# Patient Record
Sex: Female | Born: 1980 | Race: Black or African American | Hispanic: No | Marital: Single | Smoking: Former smoker
Health system: Southern US, Community
[De-identification: ages and names within clinical notes are randomized; demographics above are authoritative.]

## PROBLEM LIST (undated history)

## (undated) ENCOUNTER — Inpatient Hospital Stay (HOSPITAL_COMMUNITY): Payer: Self-pay

## (undated) DIAGNOSIS — F909 Attention-deficit hyperactivity disorder, unspecified type: Secondary | ICD-10-CM

## (undated) DIAGNOSIS — R519 Headache, unspecified: Secondary | ICD-10-CM

## (undated) DIAGNOSIS — F32A Depression, unspecified: Secondary | ICD-10-CM

## (undated) DIAGNOSIS — R51 Headache: Secondary | ICD-10-CM

## (undated) DIAGNOSIS — F329 Major depressive disorder, single episode, unspecified: Secondary | ICD-10-CM

## (undated) DIAGNOSIS — D649 Anemia, unspecified: Secondary | ICD-10-CM

## (undated) HISTORY — DX: Depression, unspecified: F32.A

## (undated) HISTORY — PX: NO PAST SURGERIES: SHX2092

## (undated) HISTORY — DX: Headache: R51

## (undated) HISTORY — DX: Headache, unspecified: R51.9

## (undated) HISTORY — DX: Attention-deficit hyperactivity disorder, unspecified type: F90.9

## (undated) HISTORY — DX: Anemia, unspecified: D64.9

## (undated) HISTORY — DX: Major depressive disorder, single episode, unspecified: F32.9

---
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
## 2000-03-17 ENCOUNTER — Encounter: Admission: RE | Admit: 2000-03-17 | Discharge: 2000-03-17 | Payer: Self-pay | Admitting: Sports Medicine

## 2000-03-17 ENCOUNTER — Ambulatory Visit (HOSPITAL_COMMUNITY): Admission: RE | Admit: 2000-03-17 | Discharge: 2000-03-17 | Payer: Self-pay | Admitting: Sports Medicine

## 2010-12-06 ENCOUNTER — Emergency Department (HOSPITAL_COMMUNITY)
Admission: EM | Admit: 2010-12-06 | Discharge: 2010-12-06 | Disposition: A | Discharge status: Home / Self Care | Payer: Self-pay | Attending: Emergency Medicine | Admitting: Emergency Medicine

## 2010-12-06 DIAGNOSIS — N12 Tubulo-interstitial nephritis, not specified as acute or chronic: Secondary | ICD-10-CM | POA: Insufficient documentation

## 2010-12-06 DIAGNOSIS — N739 Female pelvic inflammatory disease, unspecified: Secondary | ICD-10-CM | POA: Insufficient documentation

## 2010-12-06 LAB — URINALYSIS, ROUTINE W REFLEX MICROSCOPIC
Bilirubin Urine: NEGATIVE
Glucose, UA: 100 mg/dL — AB
Ketones, ur: >80 mg/dL — AB
Protein, ur: 100 mg/dL — AB

## 2010-12-06 LAB — URINE MICROSCOPIC-ADD ON

## 2010-12-06 LAB — WET PREP, GENITAL
Trich, Wet Prep: NONE SEEN
Yeast Wet Prep HPF POC: NONE SEEN

## 2011-11-14 ENCOUNTER — Telehealth: Payer: Self-pay

## 2011-11-14 NOTE — Telephone Encounter (Signed)
Tb test results from Oct 2012 faxed with confirmation.

## 2011-11-14 NOTE — Telephone Encounter (Signed)
PT NEEDING COPY OF LAST TB TEST FAXED TO [facility] @ [phone]   BEST PHONE [phone]

## 2014-01-08 ENCOUNTER — Inpatient Hospital Stay (HOSPITAL_COMMUNITY)
Admission: AD | Admit: 2014-01-08 | Discharge: 2014-01-08 | Disposition: A | Discharge status: Home / Self Care | Payer: Medicaid Other | Source: Ambulatory Visit | Attending: Obstetrics and Gynecology | Admitting: Obstetrics and Gynecology

## 2014-01-08 ENCOUNTER — Encounter (HOSPITAL_COMMUNITY): Payer: Self-pay | Admitting: Advanced Practice Midwife

## 2014-01-08 ENCOUNTER — Encounter (HOSPITAL_COMMUNITY): Payer: Self-pay | Admitting: Emergency Medicine

## 2014-01-08 ENCOUNTER — Emergency Department (HOSPITAL_COMMUNITY)
Admission: EM | Admit: 2014-01-08 | Discharge: 2014-01-08 | Discharge status: Against Medical Advice | Payer: BC Managed Care – PPO | Attending: Emergency Medicine | Admitting: Emergency Medicine

## 2014-01-08 DIAGNOSIS — R638 Other symptoms and signs concerning food and fluid intake: Secondary | ICD-10-CM | POA: Insufficient documentation

## 2014-01-08 DIAGNOSIS — Z87891 Personal history of nicotine dependence: Secondary | ICD-10-CM | POA: Insufficient documentation

## 2014-01-08 DIAGNOSIS — O210 Mild hyperemesis gravidarum: Secondary | ICD-10-CM | POA: Insufficient documentation

## 2014-01-08 DIAGNOSIS — O219 Vomiting of pregnancy, unspecified: Secondary | ICD-10-CM

## 2014-01-08 DIAGNOSIS — O9989 Other specified diseases and conditions complicating pregnancy, childbirth and the puerperium: Secondary | ICD-10-CM | POA: Diagnosis not present

## 2014-01-08 LAB — URINALYSIS, ROUTINE W REFLEX MICROSCOPIC
Glucose, UA: NEGATIVE mg/dL
Ketones, ur: >80 mg/dL — AB
LEUKOCYTES UA: NEGATIVE
NITRITE: NEGATIVE
Specific Gravity, Urine: >1.03 — ABNORMAL HIGH (ref 1.005–1.030)
UROBILINOGEN UA: 1 mg/dL (ref 0.0–1.0)
pH: 6.5 (ref 5.0–8.0)

## 2014-01-08 LAB — URINE MICROSCOPIC-ADD ON

## 2014-01-08 LAB — POCT PREGNANCY, URINE: PREG TEST UR: POSITIVE — AB

## 2014-01-08 MED ORDER — PROMETHAZINE HCL 25 MG PO TABS
25.0000 mg | ORAL_TABLET | Freq: Once | ORAL | Status: DC
  Filled 2014-01-08: qty 1

## 2014-01-08 MED ORDER — LACTATED RINGERS IV SOLN
25.0000 mg | Freq: Once | INTRAVENOUS | Status: DC
  Filled 2014-01-08: qty 1

## 2014-01-08 MED ORDER — PROMETHAZINE HCL 25 MG/ML IJ SOLN: 25.0000 mg | Freq: Once | INTRAVENOUS | Status: DC

## 2014-01-08 MED ORDER — METOCLOPRAMIDE HCL 5 MG/ML IJ SOLN: 10.0000 mg | Freq: Once | INTRAMUSCULAR | Status: DC

## 2014-01-08 MED ORDER — DEXTROSE 5 % IN LACTATED RINGERS IV BOLUS: 1000.0000 mL | Freq: Once | INTRAVENOUS | Status: DC

## 2014-01-08 MED ORDER — NATACHEW 28-1 MG PO CHEW: 1.0000 | CHEWABLE_TABLET | Freq: Every day | ORAL | Status: DC

## 2014-01-08 MED ORDER — PROMETHAZINE HCL 25 MG PO TABS: 25.0000 mg | ORAL_TABLET | Freq: Four times a day (QID) | ORAL | Status: DC | PRN

## 2014-01-08 NOTE — MAU Provider Note (Signed)
Chief Complaint: Morning Sickness and Emesis During Pregnancy   First Provider Initiated Contact with Patient 01/08/14 2118     SUBJECTIVE HPI: [name] is a 33 y.o. G1P0 at 6.4 weeks by LMP who presents with vomiting 6x per day x 2 weeks and possible dehydration. [facility] at [facility] ago for nausea, vomiting and hypokalemia. IV fluids and Rx Phenergan, Zofran and potassium. Not taking antiemetics because she states she is not having nausea, only vomits after eating or drinking. Has kept down about half of the doses of potassium. States she has lost 10 pounds in 2 weeks. Last vomited at 10 AM.  Does not know where she plans to get prenatal care.  Past Medical History  Diagnosis Date  . Medical history non-contributory    OB History  Gravida Para Term Preterm AB SAB TAB Ectopic Multiple Living  5    4  4    0    # Outcome Date GA Lbr Len/2nd Weight Sex Delivery Anes PTL Lv  5 CUR           4 TAB           3 TAB           2 TAB           1 TAB              Past Surgical History  Procedure Laterality Date  . No past surgeries     History   Social History  . Marital Status: Single    Spouse Name: N/A    Number of Children: N/A  . Years of Education: N/A   Occupational History  . Not on file.   Social History Main Topics  . Smoking status: Former Games developermoker  . Smokeless tobacco: Not on file  . Alcohol Use: No  . Drug Use: No  . Sexual Activity: Yes   Other Topics Concern  . Not on file   Social History Narrative  . No narrative on file   No current facility-administered medications on file prior to encounter.   No current outpatient prescriptions on file prior to encounter.   No Known Allergies  ROS: Pertinent positive items in HPI. Negative for fever, chills, nausea, diarrhea, constipation, dysuria, urgency, frequency, flank pain, vaginal bleeding, abdominal pain, vaginal discharge, weakness or dizziness.  OBJECTIVE Blood pressure 131/90, pulse 101,  temperature 98.2 F (36.8 C), temperature source Oral, resp. rate 18, height 5' 6.2" (1.681 m), weight 59.24 kg (130 lb 9.6 oz), last menstrual period 11/24/2013, SpO2 99.00%. Patient Vitals for the past 24 hrs:  BP Temp Temp src Pulse Resp SpO2 Height Weight  01/08/14 2243 117/77 mmHg - - 72 18 - - -  01/08/14 1947 131/90 mmHg 98.2 F (36.8 C) Oral 101 18 99 % 5' 6.2" (1.681 m) 59.24 kg (130 lb 9.6 oz)   GENERAL: Well-developed, well-nourished, slender female in no acute distress.  HEENT: Normocephalic. Mucous membranes moist. HEART: normal rate RESP: normal effort ABDOMEN: Soft, non-tender NEURO: Alert and oriented SPECULUM EXAM: Deferred  LAB RESULTS Results for orders placed during the hospital encounter of 01/08/14 (from the past 24 hour(s))  URINALYSIS, ROUTINE W REFLEX MICROSCOPIC     Status: Abnormal   Collection Time    01/08/14  7:57 PM      Result Value Ref Range   Color, Urine AMBER (*) YELLOW   APPearance CLEAR  CLEAR   Specific Gravity, Urine >1.030 (*) 1.005 - 1.030  pH 6.5  5.0 - 8.0   Glucose, UA NEGATIVE  NEGATIVE mg/dL   Hgb urine dipstick LARGE (*) NEGATIVE   Bilirubin Urine MODERATE (*) NEGATIVE   Ketones, ur >80 (*) NEGATIVE mg/dL   Protein, ur >161>300 (*) NEGATIVE mg/dL   Urobilinogen, UA 1.0  0.0 - 1.0 mg/dL   Nitrite NEGATIVE  NEGATIVE   Leukocytes, UA NEGATIVE  NEGATIVE  URINE MICROSCOPIC-ADD ON     Status: Abnormal   Collection Time    01/08/14  7:57 PM      Result Value Ref Range   Squamous Epithelial / LPF MANY (*) RARE   WBC, UA 3-6  <3 WBC/hpf   RBC / HPF 11-20  <3 RBC/hpf   Bacteria, UA MANY (*) RARE   Urine-Other MUCOUS PRESENT    POCT PREGNANCY, URINE     Status: Abnormal   Collection Time    01/08/14  8:00 PM      Result Value Ref Range   Preg Test, Ur POSITIVE (*) NEGATIVE    IMAGING No results found.  MAU COURSE IV bolus and phenergan.--Refused.  Refused PO phenergan. Tolerating ice chips and water w/out meds.    ASSESSMENT 1. Nausea and vomiting during pregnancy prior to [redacted] weeks gestation     PLAN Discharge home in stable condition. Take meds on schedule. Advance diet slowly.     Follow-up Information   Follow up with [facility].      Follow [facility] with [facility].   Contact information:   [address] [id number] [facility] [id number] [phone]       Medication List    STOP taking these medications       prenatal multivitamin Tabs tablet      TAKE these medications       NATACHEW 28-1 MG Chew  Chew 1 tablet by mouth daily.     ondansetron 4 MG disintegrating tablet  Commonly known as:  ZOFRAN-ODT  Take 4 mg by mouth every 8 (eight) hours as needed for nausea or vomiting.     potassium chloride 10 MEQ tablet  Commonly known as:  K-DUR  Take 10 mEq by mouth 2 (two) times daily.     promethazine 25 MG tablet  Commonly known as:  PHENERGAN  Take 1 tablet (25 mg total) by mouth every 6 (six) hours as needed for nausea or vomiting.         MadelineVirginia Frisco Cordts, [place] 01/08/2014  10:38 PM

## 2014-01-08 NOTE — ED Notes (Signed)
[facility]

## 2014-01-08 NOTE — Discharge Instructions (Signed)
Hyperemesis Gravidarum °Hyperemesis gravidarum is a severe form of nausea and vomiting that happens during pregnancy. Hyperemesis is worse than morning sickness. It may cause you to have nausea or vomiting all day for many days. It may keep you from eating and drinking enough food and liquids. Hyperemesis usually occurs during the first half (the first 20 weeks) of pregnancy. It often goes away once a woman is in her second half of pregnancy. However, sometimes hyperemesis continues through an entire pregnancy.  °CAUSES  °The cause of this condition is not completely known but is thought to be related to changes in the body's hormones when pregnant. It could be from the high level of the pregnancy hormone or an increase in estrogen in the body.  °SIGNS AND SYMPTOMS  °· Severe nausea and vomiting. °· Nausea that does not go away. °· Vomiting that does not allow you to keep any food down. °· Weight loss and body fluid loss (dehydration). °· Having no desire to eat or not liking food you have previously enjoyed. °DIAGNOSIS  °Your health care provider will do a physical exam and ask you about your symptoms. He or she may also order blood tests and urine tests to make sure something else is not causing the problem.  °TREATMENT  °You may only need medicine to control the problem. If medicines do not control the nausea and vomiting, you will be treated in the hospital to prevent dehydration, increased acid in the blood (acidosis), weight loss, and changes in the electrolytes in your body that may harm the unborn baby (fetus). You may need IV fluids.  °HOME CARE INSTRUCTIONS  °· Only take over-the-counter or prescription medicines as directed by your health care provider. °· Try eating a couple of dry crackers or toast in the morning before getting out of bed. °· Avoid foods and smells that upset your stomach. °· Avoid fatty and spicy foods. °· Eat 5-6 small meals a day. °· Do not drink when eating meals. Drink between  meals. °· For snacks, eat high-protein foods, such as cheese. °· Eat or suck on things that have ginger in them. Ginger helps nausea. °· Avoid food preparation. The smell of food can spoil your appetite. °· Avoid iron pills and iron in your multivitamins until after 3-4 months of being pregnant. However, consult with your health care provider before stopping any prescribed iron pills. °SEEK MEDICAL CARE IF:  °· Your abdominal pain increases. °· You have a severe headache. °· You have vision problems. °· You are losing weight. °SEEK IMMEDIATE MEDICAL CARE IF:  °· You are unable to keep fluids down. °· You vomit blood. °· You have constant nausea and vomiting. °· You have excessive weakness. °· You have extreme thirst. °· You have dizziness or fainting. °· You have a fever or persistent symptoms for more than 2-3 days. °· You have a fever and your symptoms suddenly get worse. °MAKE SURE YOU:  °· Understand these instructions. °· Will watch your condition. °· Will get help right away if you are not doing well or get worse. °Document Released: 07/11/2005 Document Revised: 05/01/2013 Document Reviewed: 02/20/2013 °ExitCare® Patient Information ©2015 [employer]. This information is not intended to replace advice given to you by your health care provider. Make sure you discuss any questions you have with your health care provider. ° ° °Eating Plan for Hyperemesis Gravidarum °Severe cases of hyperemesis gravidarum can lead to dehydration and malnutrition. The hyperemesis eating plan is one way to lessen   symptoms of nausea and vomiting. It is often used with prescribed medicines to control your symptoms.  WHAT CAN I DO TO RELIEVE MY SYMPTOMS? Listen to your body. Everyone is different and has different preferences. Find what works best for you. Some of the following things may help:  Eat and drink slowly.  Eat 5-6 small meals daily instead of 3 large meals.   Eat crackers before you get out of bed in the  morning.   Starchy foods are usually well tolerated (such as cereal, toast, bread, potatoes, pasta, rice, and pretzels).   Ginger may help with nausea. Add  tsp ground ginger to hot tea or choose ginger tea.   Try drinking 100% fruit juice or an electrolyte drink.  Continue to take your prenatal vitamins as directed by your health care provider. If you are having trouble taking your prenatal vitamins, talk with your health care provider about different options.  Include at least 1 serving of protein with your meals and snacks (such as meats or poultry, beans, nuts, eggs, or yogurt). Try eating a protein-rich snack before bed (such as cheese and crackers or a half [place] or peanut butter sandwich). WHAT THINGS SHOULD I AVOID TO REDUCE MY SYMPTOMS? The following things may help reduce your symptoms:  Avoid foods with strong smells. Try eating meals in well-ventilated areas that are free of odors.  Avoid drinking water or other beverages with meals. Try not to drink anything less than 30 minutes before and after meals.  Avoid drinking more than 1 cup of fluid at a time.  Avoid fried or high-fat foods, such as butter and cream sauces.  Avoid spicy foods.  Avoid skipping meals the best you can. Nausea can be more intense on an empty stomach. If you cannot tolerate food at that time, do not force it. Try sucking on ice chips or other frozen items and make up the calories later.  Avoid lying down within 2 hours after eating. Document Released: 05/08/2007 Document Revised: 07/16/2013 Document Reviewed: 05/15/2013 [facility] Patient Information 2015 [place], [place]. This information is not intended to replace advice given to you by your health care provider. Make sure you discuss any questions you have with your health care provider.  Pregnancy - First Trimester During sexual intercourse, millions of sperm go into the vagina. Only 1 sperm will penetrate and fertilize the female egg while it  is in the Fallopian tube. One week later, the fertilized egg implants into the wall of the uterus. An embryo begins to develop into a baby. At 6 to 8 weeks, the eyes and face are formed and the heartbeat can be seen on ultrasound. At the end of 12 weeks (first trimester), all the baby's organs are formed. Now that you are pregnant, you will want to do everything you can to have a healthy baby. Two of the most important things are to get good prenatal care and follow your caregiver's instructions. Prenatal care is all the medical care you receive before the baby's birth. It is given to prevent, find, and treat problems during the pregnancy and childbirth. PRENATAL EXAMS  During prenatal visits, your weight, blood pressure, and urine are checked. This is done to make sure you are healthy and progressing normally during the pregnancy.  A pregnant woman should gain 25 to 35 pounds during the pregnancy. However, if you are overweight or underweight, your caregiver will advise you regarding your weight.  Your caregiver will ask and answer questions for you.  Blood work, cervical cultures, other necessary tests, and a Pap test are done during your prenatal exams. These tests are done to check on your health and the probable health of your baby. Tests are strongly recommended and done for HIV with your permission. This is the virus that causes AIDS. These tests are done because medicines can be given to help prevent your baby from being born with this infection should you have been infected without knowing it. Blood work is also used to find out your blood type, previous infections, and follow your blood levels (hemoglobin).  Low hemoglobin (anemia) is common during pregnancy. Iron and vitamins are given to help prevent this. Later in the pregnancy, blood tests for diabetes will be done along with any other tests if any problems develop.  You may need other tests to make sure you and the baby are doing  well. CHANGES DURING THE FIRST TRIMESTER  Your body goes through many changes during pregnancy. They vary from person to person. Talk to your caregiver about changes you notice and are concerned about. Changes can include:  Your menstrual period stops.  The egg and sperm carry the genes that determine what you look like. Genes from you and your partner are forming a baby. The female genes determine whether the baby is a boy or a girl.  Your body increases in girth and you may feel bloated.  Feeling sick to your stomach (nauseous) and throwing up (vomiting). If the vomiting is uncontrollable, call your caregiver.  Your breasts will begin to enlarge and become tender.  Your nipples may stick out more and become darker.  The need to urinate more. Painful urination may mean you have a bladder infection.  Tiring easily.  Loss of appetite.  Cravings for certain kinds of food.  At first, you may gain or lose a couple of pounds.  You may have changes in your emotions from day to day (excited to be pregnant or concerned something may go wrong with the pregnancy and baby).  You may have more vivid and strange dreams. HOME CARE INSTRUCTIONS   It is very important to avoid all smoking, alcohol and non-prescribed drugs during your pregnancy. These affect the formation and growth of the baby. Avoid chemicals while pregnant to ensure the delivery of a healthy infant.  Start your prenatal visits by the 12th week of pregnancy. They are usually scheduled monthly at first, then more often in the last 2 months before delivery. Keep your caregiver's appointments. Follow your caregiver's instructions regarding medicine use, blood and lab tests, exercise, and diet.  During pregnancy, you are providing food for you and your baby. Eat regular, well-balanced meals. Choose foods such as meat, fish, milk and other low fat dairy products, vegetables, fruits, and whole-grain breads and cereals. Your caregiver  will tell you of the ideal weight gain.  You can help morning sickness by keeping soda crackers at the bedside. Eat a couple before arising in the morning. You may want to use the crackers without salt on them.  Eating 4 to 5 small meals rather than 3 large meals a day also may help the nausea and vomiting.  Drinking liquids between meals instead of during meals also seems to help nausea and vomiting.  A physical sexual relationship may be continued throughout pregnancy if there are no other problems. Problems may be early (premature) leaking of amniotic fluid from the membranes, vaginal bleeding, or belly (abdominal) pain.  Exercise regularly if there are no  restrictions. Check with your caregiver or physical therapist if you are unsure of the safety of some of your exercises. Greater weight gain will occur in the last 2 trimesters of pregnancy. Exercising will help:  Control your weight.  Keep you in shape.  Prepare you for labor and delivery.  Help you lose your pregnancy weight after you deliver your baby.  Wear a good support or jogging bra for breast tenderness during pregnancy. This may help if worn during sleep too.  Ask when prenatal classes are available. Begin classes when they are offered.  Do not use hot tubs, steam rooms, or saunas.  Wear your seat belt when driving. This protects you and your baby if you are in an accident.  Avoid raw meat, uncooked cheese, cat litter boxes, and soil used by cats throughout the pregnancy. These carry germs that can cause birth defects in the baby.  The first trimester is a good time to visit your dentist for your dental health. Getting your teeth cleaned is okay. Use a softer toothbrush and brush gently during pregnancy.  Ask for help if you have financial, counseling, or nutritional needs during pregnancy. Your caregiver will be able to offer counseling for these needs as well as refer you for other special needs.  Do not take any  medicines or herbs unless told by your caregiver.  Inform your caregiver if there is any mental or physical domestic violence.  Make a list of emergency phone numbers of family, friends, hospital, and police and fire departments.  Write down your questions. Take them to your prenatal visit.  Do not douche.  Do not cross your legs.  If you have to stand for long periods of time, rotate you feet or take small steps in a circle.  You may have more vaginal secretions that may require a sanitary pad. Do not use tampons or scented sanitary pads. MEDICINES AND DRUG USE IN PREGNANCY  Take prenatal vitamins as directed. The vitamin should contain 1 milligram of folic acid. Keep all vitamins out of reach of children. Only a couple vitamins or tablets containing iron may be fatal to a baby or young child when ingested.  Avoid use of all medicines, including herbs, over-the-counter medicines, not prescribed or suggested by your caregiver. Only take over-the-counter or prescription medicines for pain, discomfort, or fever as directed by your caregiver. Do not use aspirin, ibuprofen, or naproxen unless directed by your caregiver.  Let your caregiver also know about herbs you may be using.  Alcohol is related to a number of birth defects. This includes fetal alcohol syndrome. All alcohol, in any form, should be avoided completely. Smoking will cause low birth rate and premature babies.  Street or illegal drugs are very harmful to the baby. They are absolutely forbidden. A baby born to an addicted mother will be addicted at birth. The baby will go through the same withdrawal an adult does.  Let your caregiver know about any medicines that you have to take and for what reason you take them. SEEK MEDICAL CARE IF:  You have any concerns or worries during your pregnancy. It is better to call with your questions if you feel they cannot wait, rather than worry about them. SEEK IMMEDIATE MEDICAL CARE IF:    An unexplained oral temperature above 102 F (38.9 C) develops, or as your caregiver suggests.  You have leaking of fluid from the vagina (birth canal). If leaking membranes are suspected, take your temperature and inform your  caregiver of this when you call.  There is vaginal spotting or bleeding. Notify your caregiver of the amount and how many pads are used.  You develop a bad smelling vaginal discharge with a change in the color.  You continue to feel sick to your stomach (nauseated) and have no relief from remedies suggested. You vomit blood or coffee ground-like materials.  You lose more than 2 pounds of weight in 1 week.  You gain more than 2 pounds of weight in 1 week and you notice swelling of your face, hands, feet, or legs.  You gain 5 pounds or more in 1 week (even if you do not have swelling of your hands, face, legs, or feet).  You get exposed to [place] measles and have never had them.  You are exposed to fifth disease or chickenpox.  You develop belly (abdominal) pain. Round ligament discomfort is a common non-cancerous (benign) cause of abdominal pain in pregnancy. Your caregiver still must evaluate this.  You develop headache, fever, diarrhea, pain with urination, or shortness of breath.  You fall or are in a car accident or have any kind of trauma.  There is mental or physical violence in your home. Document Released: 07/05/2001 Document Revised: 04/04/2012 Document Reviewed: 05/21/2013 [facility] Patient Information 2015 [place]Care, [place]LLC. This information is not intended to replace advice given to you by your health care provider. Make sure you discuss any questions you have with your health care provider.

## 2014-01-08 NOTE — ED Notes (Signed)
Pt. reports nausea and vomitting for the past several days , poor appetite , dry mouth and weight loss , pt. stated she is 3 months pregnant ( G1P0) with no prenatal check up.

## 2014-01-08 NOTE — MAU Note (Signed)
Pt. States she last threw up this am around 10 after eating applesauce. But did eat a plum afterwards and has not thrown that up. Pt. Has Rx for Zofran, Phenergan and Potassium. [facility]. Pt. States she has lost #10 lbs in the past week. Has an appointment with [facility] tomorrow. Has not had prenatal care yet.

## 2014-01-08 NOTE — MAU Note (Signed)
States nausea and vomiting since she found out she was pregnant. [facility] because [facility] were [facility]. Not actively vomiting in triage. States [facility] and nausea medication. States she has lost 10 pounds in 2 weeks. Denies vaginal bleeding/discharge. No pain. States she just feels dehydrated.

## 2014-01-09 ENCOUNTER — Encounter (HOSPITAL_COMMUNITY): Payer: Self-pay

## 2014-01-11 LAB — CULTURE, OB URINE: Special Requests: NORMAL

## 2014-01-17 NOTE — MAU Provider Note (Signed)
Attestation of Attending Supervision of Advanced Practitioner: Evaluation and management procedures were performed by the PA/NP/CNM/OB Fellow under my supervision/collaboration. Chart reviewed and agree with management and plan.  FERGUSON,JOHN V 01/17/2014 1:47 PM

## 2014-03-17 ENCOUNTER — Other Ambulatory Visit (HOSPITAL_COMMUNITY): Payer: Self-pay | Admitting: Nurse Practitioner

## 2014-03-17 DIAGNOSIS — Z3689 Encounter for other specified antenatal screening: Secondary | ICD-10-CM

## 2014-03-17 LAB — OB RESULTS CONSOLE HIV ANTIBODY (ROUTINE TESTING): HIV: NONREACTIVE

## 2014-03-17 LAB — OB RESULTS CONSOLE RUBELLA ANTIBODY, IGM: Rubella: IMMUNE

## 2014-03-17 LAB — OB RESULTS CONSOLE HEPATITIS B SURFACE ANTIGEN: HEP B S AG: NEGATIVE

## 2014-03-17 LAB — OB RESULTS CONSOLE ANTIBODY SCREEN: Antibody Screen: NEGATIVE

## 2014-03-17 LAB — OB RESULTS CONSOLE ABO/RH: RH TYPE: POSITIVE

## 2014-04-11 ENCOUNTER — Ambulatory Visit (HOSPITAL_COMMUNITY)
Admission: RE | Admit: 2014-04-11 | Discharge: 2014-04-11 | Disposition: A | Discharge status: Home / Self Care | Payer: Medicaid Other | Source: Ambulatory Visit | Attending: Nurse Practitioner | Admitting: Nurse Practitioner

## 2014-04-11 DIAGNOSIS — Z3689 Encounter for other specified antenatal screening: Secondary | ICD-10-CM

## 2014-04-11 DIAGNOSIS — O358XX0 Maternal care for other (suspected) fetal abnormality and damage, not applicable or unspecified: Secondary | ICD-10-CM | POA: Insufficient documentation

## 2014-05-26 ENCOUNTER — Encounter (HOSPITAL_COMMUNITY): Payer: Self-pay

## 2014-06-03 LAB — OB RESULTS CONSOLE RPR: RPR: NONREACTIVE

## 2014-07-25 NOTE — L&D Delivery Note (Signed)
Vaginal Delivery Note The pt utilized an epidural as pain management.   Spontaneous rupture of membranes yesterday, at 2036, clear.  GBS was negative.  Cervical dilation was complete at  0117.     Pushing with guidance began at 0140.   After 39 minutes of pushing the head, shoulders and the body of a viable female infant "[name]" delivered spontaneously with maternal effort in the ROA position at 0219.   With vigorous tone and spontaneous cry, the infant was placed on moms abd.  After the umbilical cord was clamped it was cut by the FOB, then cord blood was obtained for evaluation.  Spontaneous delivery of a intact placenta with a 3 vessel cord via [place] at [id number].   Episiotomy: None   The vulva, perineum, vaginal vault, rectum and cervix were inspected and revealed a superficial bilateral labial and left periurethral laceration which was repaired using a 4-0 vicryl on a SH.  Lidocaine was not used, the epidural was sufficient for the repair.    Postpartum pitocin as ordered.  Fundus firm, lochia minimum, bleeding under control. EBL 200, Pt hemodynamically stable.   Sponge, laps and needle count correct and verified with the primary care nurse.  Attending MD available at all times.    Mom and baby were left in stable condition, baby skin to skin. Routine postpartum orders   Mother unsure about method of contraception   Placenta to pathology: NO     Cord Gases sent to lab: NO Cord blood sent to lab: YES   APGARS:  8 at 1 minute and 8 at 5 minutes Weight:.      Lexii Walsh, CNM, MSN 08/28/2014. 3:11 AM

## 2014-07-29 LAB — OB RESULTS CONSOLE GBS: STREP GROUP B AG: NEGATIVE

## 2014-08-26 ENCOUNTER — Inpatient Hospital Stay (HOSPITAL_COMMUNITY)
Admission: AD | Admit: 2014-08-26 | Discharge: 2014-08-26 | Disposition: A | Discharge status: Home / Self Care | Payer: Medicaid Other | Source: Ambulatory Visit | Attending: Obstetrics and Gynecology | Admitting: Obstetrics and Gynecology

## 2014-08-26 ENCOUNTER — Encounter (HOSPITAL_COMMUNITY): Payer: Self-pay | Admitting: General Practice

## 2014-08-26 DIAGNOSIS — O99343 Other mental disorders complicating pregnancy, third trimester: Secondary | ICD-10-CM | POA: Insufficient documentation

## 2014-08-26 DIAGNOSIS — O9989 Other specified diseases and conditions complicating pregnancy, childbirth and the puerperium: Secondary | ICD-10-CM | POA: Insufficient documentation

## 2014-08-26 DIAGNOSIS — D582 Other hemoglobinopathies: Secondary | ICD-10-CM | POA: Diagnosis present

## 2014-08-26 DIAGNOSIS — Z8759 Personal history of other complications of pregnancy, childbirth and the puerperium: Secondary | ICD-10-CM

## 2014-08-26 DIAGNOSIS — F32A Depression, unspecified: Secondary | ICD-10-CM | POA: Diagnosis not present

## 2014-08-26 DIAGNOSIS — F329 Major depressive disorder, single episode, unspecified: Secondary | ICD-10-CM | POA: Insufficient documentation

## 2014-08-26 DIAGNOSIS — Z3A40 40 weeks gestation of pregnancy: Secondary | ICD-10-CM | POA: Insufficient documentation

## 2014-08-26 DIAGNOSIS — F988 Other specified behavioral and emotional disorders with onset usually occurring in childhood and adolescence: Secondary | ICD-10-CM | POA: Diagnosis present

## 2014-08-26 DIAGNOSIS — Z87891 Personal history of nicotine dependence: Secondary | ICD-10-CM

## 2014-08-26 LAB — AMNISURE RUPTURE OF MEMBRANE (ROM) NOT AT ARMC: Amnisure ROM: NEGATIVE

## 2014-08-26 NOTE — MAU Note (Signed)
Was going to the bathroom, when went to sit down- had 3 leaks, doesn't think it was pee. Was 2-3 yesterday. Is contracting, q 10

## 2014-08-26 NOTE — MAU Provider Note (Signed)
History   34 yo G5P0040 at 40 5/7 weeks presented after calling to report ? leaking.  Reports cervix was 2-3 in the office yesterday by Dr. Stefano GaulStringer, now UCs q 10 min.  Denies bleeding, reports +FM.  Patient Active Problem List   Diagnosis Date Noted  . Amniotic fluid leaking 08/26/2014  . 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 ADD (attention deficit disorder) 08/26/2014  . Depression 08/26/2014  . H/O abortion x 4 08/26/2014    Chief Complaint  Patient presents with  . ? leaking    HPI:  As above  OB History    Gravida Para Term Preterm AB TAB SAB Ectopic Multiple Living   5    4 4     0      Past Medical History  Diagnosis Date  . Medical history non-contributory     Past Surgical History  Procedure Laterality Date  . No past surgeries      History reviewed. No pertinent family history.  History  Substance Use Topics  . Smoking status: Former Smoker    Quit date: 12/28/2013  . Smokeless tobacco: Not on file  . Alcohol Use: No    Allergies: No Known Allergies  Prescriptions prior to admission  Medication Sig Dispense Refill Last Dose  . acetaminophen (TYLENOL) 500 MG tablet Take 1,000 mg by mouth every 6 (six) hours as needed.   08/26/2014 at Unknown time  . Prenatal Vit-Fe Fum-Fe Bisg-FA (NATACHEW) 28-1 MG CHEW Chew 1 tablet by mouth daily. 30 tablet 12 08/25/2014 at Unknown time  . promethazine (PHENERGAN) 25 MG tablet Take 1 tablet (25 mg total) by mouth every 6 (six) hours as needed for nausea or vomiting. 30 tablet 2 prn    ROS:  Leaking small amount d/c, UCs q 10 min, +FM Physical Exam   Blood pressure 113/72, pulse 68, temperature 98.9 F (37.2 C), temperature source Oral, resp. rate 18, weight 164 lb (74.39 kg), last menstrual period 11/24/2013.    Physical Exam  Chest clear Heart RRR without murmur Abd gravid, NT Pelvic--cervix tight 2 cm, 80%, vtx, -1, IBOW, slightly bulging, no leaking Ext WNL  FHR Category 1 UCs irregular in quality and pattern,  q 8-10 min.  Results for orders placed or performed during the hospital encounter of 08/26/14 (from the past 24 hour(s))  Amnisure rupture of membrane (rom)     Status: None   Collection Time: 08/26/14  9:31 AM  Result Value Ref Range   Amnisure ROM NEGATIVE      ED Course  Assessment: IUP at 40 5/7 weeks Likely prodromal labor GBS negative   Plan: D/C home to await improvement in pattern and quality of UCs. Keep scheduled appt for induction on Thursday for induction--to return with any change in status. Comfort measures for prodromal labor reviewed.   Nigel BridgemanLATHAM, Annahi Short CNM, MSN 08/26/2014 10:30 AM

## 2014-08-26 NOTE — Discharge Instructions (Signed)
Braxton Hicks Contractions °Contractions of the uterus can occur throughout pregnancy. Contractions are not always a sign that you are in labor.  °WHAT ARE BRAXTON HICKS CONTRACTIONS?  °Contractions that occur before labor are called Braxton Hicks contractions, or false labor. Toward the end of pregnancy (32-34 weeks), these contractions can develop more often and may become more forceful. This is not true labor because these contractions do not result in opening (dilatation) and thinning of the cervix. They are sometimes difficult to tell apart from true labor because these contractions can be forceful and people have different pain tolerances. You should not feel embarrassed if you go to the hospital with false labor. Sometimes, the only way to tell if you are in true labor is for your health care provider to look for changes in the cervix. °If there are no prenatal problems or other health problems associated with the pregnancy, it is completely safe to be sent home with false labor and await the onset of true labor. °HOW CAN YOU TELL THE DIFFERENCE BETWEEN TRUE AND FALSE LABOR? °False Labor °· The contractions of false labor are usually shorter and not as hard as those of true labor.   °· The contractions are usually irregular.   °· The contractions are often felt in the front of the lower abdomen and in the groin.   °· The contractions may go away when you walk around or change positions while lying down.   °· The contractions get weaker and are shorter lasting as time goes on.   °· The contractions do not usually become progressively stronger, regular, and closer together as with true labor.   °True Labor °· Contractions in true labor last 30-70 seconds, become very regular, usually become more intense, and increase in frequency.   °· The contractions do not go away with walking.   °· The discomfort is usually felt in the top of the uterus and spreads to the lower abdomen and low back.   °· True labor can be  determined by your health care provider with an exam. This will show that the cervix is dilating and getting thinner.   °WHAT TO REMEMBER °· Keep up with your usual exercises and follow other instructions given by your health care provider.   °· Take medicines as directed by your health care provider.   °· Keep your regular prenatal appointments.   °· Eat and drink lightly if you think you are going into labor.   °· If Braxton Hicks contractions are making you uncomfortable:   °¨ Change your position from lying down or resting to walking, or from walking to resting.   °¨ Sit and rest in a tub of warm water.   °¨ Drink 2-3 glasses of water. Dehydration may cause these contractions.   °¨ Do slow and deep breathing several times an hour.   °WHEN SHOULD I SEEK IMMEDIATE MEDICAL CARE? °Seek immediate medical care if: °· Your contractions become stronger, more regular, and closer together.   °· You have fluid leaking or gushing from your vagina.   °· You have a fever.   °· You pass blood-tinged mucus.   °· You have vaginal bleeding.   °· You have continuous abdominal pain.   °· You have low back pain that you never had before.   °· You feel your baby's head pushing down and causing pelvic pressure.   °· Your baby is not moving as much as it used to.   °Document Released: 07/11/2005 Document Revised: 07/16/2013 Document Reviewed: 04/22/2013 °Exit[employer]® Patient Information ©2015 [employer]. This information is not intended to replace advice given to you by your health care   provider. Make sure you discuss any questions you have with your health care provider. ° °

## 2014-08-27 ENCOUNTER — Inpatient Hospital Stay (HOSPITAL_COMMUNITY)
Admission: AD | Admit: 2014-08-27 | Discharge: 2014-08-30 | DRG: 775 | Disposition: A | Discharge status: Home / Self Care | Payer: Medicaid Other | Source: Ambulatory Visit | Attending: Obstetrics & Gynecology | Admitting: Obstetrics & Gynecology

## 2014-08-27 ENCOUNTER — Telehealth (HOSPITAL_COMMUNITY): Payer: Self-pay | Admitting: *Deleted

## 2014-08-27 ENCOUNTER — Encounter (HOSPITAL_COMMUNITY): Payer: Self-pay | Admitting: *Deleted

## 2014-08-27 ENCOUNTER — Inpatient Hospital Stay (HOSPITAL_COMMUNITY): Payer: Medicaid Other | Admitting: Anesthesiology

## 2014-08-27 DIAGNOSIS — Z87891 Personal history of nicotine dependence: Secondary | ICD-10-CM | POA: Diagnosis not present

## 2014-08-27 DIAGNOSIS — O480 Post-term pregnancy: Principal | ICD-10-CM | POA: Diagnosis present

## 2014-08-27 DIAGNOSIS — F909 Attention-deficit hyperactivity disorder, unspecified type: Secondary | ICD-10-CM | POA: Diagnosis present

## 2014-08-27 DIAGNOSIS — Z3A41 41 weeks gestation of pregnancy: Secondary | ICD-10-CM | POA: Diagnosis present

## 2014-08-27 DIAGNOSIS — D582 Other hemoglobinopathies: Secondary | ICD-10-CM | POA: Diagnosis not present

## 2014-08-27 DIAGNOSIS — O99344 Other mental disorders complicating childbirth: Secondary | ICD-10-CM | POA: Diagnosis present

## 2014-08-27 DIAGNOSIS — F329 Major depressive disorder, single episode, unspecified: Secondary | ICD-10-CM | POA: Diagnosis present

## 2014-08-27 LAB — CBC
HEMATOCRIT: 34.5 % — AB (ref 36.0–46.0)
Hemoglobin: 12.5 g/dL (ref 12.0–15.0)
MCH: 30.4 pg (ref 26.0–34.0)
MCHC: 36.2 g/dL — ABNORMAL HIGH (ref 30.0–36.0)
MCV: 83.9 fL (ref 78.0–100.0)
Platelets: 202 10*3/uL (ref 150–400)
RBC: 4.11 MIL/uL (ref 3.87–5.11)
RDW: 13.5 % (ref 11.5–15.5)
WBC: 10.6 10*3/uL — ABNORMAL HIGH (ref 4.0–10.5)

## 2014-08-27 LAB — ABO/RH: ABO/RH(D): B POS

## 2014-08-27 LAB — TYPE AND SCREEN
ABO/RH(D): B POS
Antibody Screen: NEGATIVE

## 2014-08-27 MED ORDER — OXYCODONE-ACETAMINOPHEN 5-325 MG PO TABS: 1.0000 | ORAL_TABLET | ORAL | Status: DC | PRN

## 2014-08-27 MED ORDER — EPHEDRINE 5 MG/ML INJ
10.0000 mg | INTRAVENOUS | Status: DC | PRN
  Filled 2014-08-27: qty 2

## 2014-08-27 MED ORDER — OXYTOCIN 40 UNITS IN LACTATED RINGERS INFUSION - SIMPLE MED
62.5000 mL/h | INTRAVENOUS | Status: DC
Start: 2014-08-27 — End: 2014-08-28
  Filled 2014-08-27: qty 1000

## 2014-08-27 MED ORDER — BUTORPHANOL TARTRATE 1 MG/ML IJ SOLN
1.0000 mg | INTRAMUSCULAR | Status: DC | PRN
  Administered 2014-08-27: 1 mg via INTRAVENOUS
  Filled 2014-08-27: qty 1

## 2014-08-27 MED ORDER — FENTANYL 2.5 MCG/ML BUPIVACAINE 1/10 % EPIDURAL INFUSION (WH - ANES)
14.0000 mL/h | INTRAMUSCULAR | Status: DC | PRN
  Administered 2014-08-27 – 2014-08-28 (×2): 14 mL/h via EPIDURAL
  Filled 2014-08-27 (×2): qty 125

## 2014-08-27 MED ORDER — OXYTOCIN BOLUS FROM INFUSION
500.0000 mL | INTRAVENOUS | Status: DC
  Administered 2014-08-28: 500 mL via INTRAVENOUS

## 2014-08-27 MED ORDER — TERBUTALINE SULFATE 1 MG/ML IJ SOLN
0.2500 mg | Freq: Once | INTRAMUSCULAR | Status: AC | PRN
Start: 2014-08-27 — End: 2014-08-27

## 2014-08-27 MED ORDER — OXYCODONE-ACETAMINOPHEN 5-325 MG PO TABS: 2.0000 | ORAL_TABLET | ORAL | Status: DC | PRN

## 2014-08-27 MED ORDER — ONDANSETRON HCL 4 MG/2ML IJ SOLN: 4.0000 mg | Freq: Four times a day (QID) | INTRAMUSCULAR | Status: DC | PRN

## 2014-08-27 MED ORDER — LACTATED RINGERS IV SOLN
INTRAVENOUS | Status: DC
  Administered 2014-08-27: 18:00:00 via INTRAVENOUS

## 2014-08-27 MED ORDER — LACTATED RINGERS IV SOLN
500.0000 mL | INTRAVENOUS | Status: DC | PRN
  Administered 2014-08-27: 300 mL via INTRAVENOUS

## 2014-08-27 MED ORDER — DIPHENHYDRAMINE HCL 50 MG/ML IJ SOLN: 12.5000 mg | INTRAMUSCULAR | Status: DC | PRN

## 2014-08-27 MED ORDER — LIDOCAINE HCL (PF) 1 % IJ SOLN
INTRAMUSCULAR | Status: DC | PRN
  Administered 2014-08-27 (×2): 4 mL

## 2014-08-27 MED ORDER — LACTATED RINGERS IV SOLN
500.0000 mL | Freq: Once | INTRAVENOUS | Status: AC
  Administered 2014-08-27: 500 mL via INTRAVENOUS

## 2014-08-27 MED ORDER — OXYTOCIN 40 UNITS IN LACTATED RINGERS INFUSION - SIMPLE MED
1.0000 m[IU]/min | INTRAVENOUS | Status: DC
  Administered 2014-08-27: 1 m[IU]/min via INTRAVENOUS

## 2014-08-27 MED ORDER — PHENYLEPHRINE 40 MCG/ML (10ML) SYRINGE FOR IV PUSH (FOR BLOOD PRESSURE SUPPORT)
80.0000 ug | PREFILLED_SYRINGE | INTRAVENOUS | Status: DC | PRN
  Filled 2014-08-27: qty 2

## 2014-08-27 MED ORDER — FENTANYL 2.5 MCG/ML BUPIVACAINE 1/10 % EPIDURAL INFUSION (WH - ANES)
INTRAMUSCULAR | Status: DC | PRN
  Administered 2014-08-27: 14 mL/h via EPIDURAL

## 2014-08-27 MED ORDER — LIDOCAINE HCL (PF) 1 % IJ SOLN
30.0000 mL | INTRAMUSCULAR | Status: DC | PRN
  Filled 2014-08-27: qty 30

## 2014-08-27 MED ORDER — ACETAMINOPHEN 325 MG PO TABS: 650.0000 mg | ORAL_TABLET | ORAL | Status: DC | PRN

## 2014-08-27 MED ORDER — CITRIC ACID-SODIUM CITRATE 334-500 MG/5ML PO SOLN
30.0000 mL | ORAL | Status: DC | PRN
  Filled 2014-08-27: qty 15

## 2014-08-27 MED ORDER — PHENYLEPHRINE 40 MCG/ML (10ML) SYRINGE FOR IV PUSH (FOR BLOOD PRESSURE SUPPORT)
80.0000 ug | PREFILLED_SYRINGE | INTRAVENOUS | Status: DC | PRN
  Filled 2014-08-27: qty 2
  Filled 2014-08-27: qty 20

## 2014-08-27 NOTE — MAU Provider Note (Signed)
[name] is a 34 y.o. G5P0040 at 40.6 weeks presents from the office c/o ctx.  She denies vb or lof w/+fm   History     Patient Active Problem List   Diagnosis Date Noted  . 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 ADD (attention deficit disorder) 08/26/2014  . Depression 08/26/2014  . H/O abortion x 4 08/26/2014    Chief Complaint  Patient presents with  . Labor Eval   HPI  OB History    Gravida Para Term Preterm AB TAB SAB Ectopic Multiple Living   5    4 4     0      Past Medical History  Diagnosis Date  . Depression   . Headache   . ADHD (attention deficit hyperactivity disorder)   . Anemia     Past Surgical History  Procedure Laterality Date  . No past surgeries      Family History  Problem Relation Age of Onset  . Asthma Sister   . Depression Sister   . Asthma Maternal Grandmother   . Alcohol abuse Neg Hx   . Arthritis Neg Hx   . Birth defects Neg Hx   . Cancer Neg Hx   . COPD Neg Hx   . Diabetes Neg Hx   . Hypertension Neg Hx   . Hyperlipidemia Neg Hx   . Hearing loss Neg Hx   . Early death Neg Hx   . Heart disease Neg Hx   . Kidney disease Neg Hx   . Mental illness Neg Hx   . Mental retardation Neg Hx   . Learning disabilities Neg Hx   . Miscarriages / Stillbirths Neg Hx   . Stroke Neg Hx   . Vision loss Neg Hx   . Varicose Veins Neg Hx   . Drug abuse Other     History  Substance Use Topics  . Smoking status: Former Smoker    Quit date: 12/28/2013  . Smokeless tobacco: Never Used  . Alcohol Use: No    Allergies: No Known Allergies  Prescriptions prior to admission  Medication Sig Dispense Refill Last Dose  . acetaminophen (TYLENOL) 500 MG tablet Take 1,000 mg by mouth every 6 (six) hours as needed.   08/27/2014 at Unknown time  . Prenatal Vit-Fe Fum-Fe Bisg-FA (NATACHEW) 28-1 MG CHEW Chew 1 tablet by mouth daily. 30 tablet 12 08/27/2014 at Unknown time  . promethazine (PHENERGAN) 25 MG tablet Take 1 tablet (25 mg total) by mouth every 6  (six) hours as needed for nausea or vomiting. 30 tablet 2 More than a month at Unknown time    ROS See HPI above, all other systems are negative  Physical Exam   Blood pressure 129/84, pulse 79, temperature 97.9 F (36.6 C), temperature source Oral, resp. rate 18, last menstrual period 11/24/2013.  Physical Exam Ext:  WNL ABD: Soft, non tender to palpation, no rebound or guarding SVE: 3/90/-3   ED Course  Assessment: IUP at  40.6weeks Membranes: intact FHR: Category 1 CTX:  3 minutes   Plan:  Admit to L&D  Maddelyn Rocca, CNM, MSN 08/27/2014. 3:55 PM

## 2014-08-27 NOTE — Anesthesia Procedure Notes (Signed)
Epidural Patient location during procedure: OB Start time: 08/27/2014 5:40 PM  Staffing Anesthesiologist: Lenis Nettleton A. Performed by: anesthesiologist   Preanesthetic Checklist Completed: patient identified, site marked, surgical consent, pre-op evaluation, timeout performed, IV checked, risks and benefits discussed and monitors and equipment checked  Epidural Patient position: sitting Prep: site prepped and draped and DuraPrep Patient monitoring: continuous pulse ox and blood pressure Approach: midline Location: L4-L5 Injection technique: LOR air  Needle:  Needle type: Tuohy  Needle gauge: 17 G Needle length: 9 cm and 9 Needle insertion depth: 6 cm Catheter type: closed end flexible Catheter size: 19 Gauge Catheter at skin depth: 11 cm Test dose: negative and Other  Assessment Events: blood not aspirated, injection not painful, no injection resistance, negative IV test and no paresthesia  Additional Notes Patient identified. Risks and benefits discussed including failed block, incomplete  Pain control, post dural puncture headache, nerve damage, paralysis, blood pressure Changes, nausea, vomiting, reactions to medications-both toxic and allergic and post Partum back pain. All questions were answered. Patient expressed understanding and wished to proceed. Sterile technique was used throughout procedure. Epidural site was Dressed with sterile barrier dressing. No paresthesias, signs of intravascular injection Or signs of intrathecal spread were encountered.  Patient was more comfortable after the epidural was dosed. Please see RN's note for documentation of vital signs and FHR which are stable.

## 2014-08-27 NOTE — MAU Note (Signed)
[facility] Pt to go to room 165.

## 2014-08-27 NOTE — H&P (Signed)
[name] is a 34 y.o. female, G5 P0040 at 40.6 weeks  Patient Active Problem List   Diagnosis Date Noted  . 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 ADD (attention deficit disorder) 08/26/2014  . Depression 08/26/2014  . H/O abortion x 4 08/26/2014    Pregnancy Course: Patient transferred care from [facility] at 19 weeks.   EDC of 08/21/14 was established by LMP.      [place] evaluations:   19.5 weeks - Anatomy: S>D 21.1 weeks, FHR 132, breech, posterior placenta, cervix 3.4, Bilateral pyelectasis   Bilateral mild urinary tract dilation, right renal pelvis 5.50mm, left 5.1.  No calyceal dilation     24.5 weeks - FU:  EFW 1lb 10oz - 44.8, cervical length 3.84,  FHR 137, vertex, posteriorplacenta, cervix closed, female, bilateral pyelectasis  28.5 weeks - EFW 2lb 13oz - 37.4, cervical length 4.08, AFI 12.4, FHR 155, unilateral pyelectasis right 0.54 , left 0.39,   33.4 weeks - EFW 5lb 12oz - 44%, AFI 17.0, FHR 131, cervical klength 3.10, vertex, posterior placenta, fetal pyelectasis resolved   40.4 weeks - AFI 10.31, BPP 8/8, vertex   Significant prenatal events:   Hep C trait, ADD, Hx depression, HS for TABs, lack of food  Last evaluation:   40.6 weeks   VE:3/90/-3  Reason for admission:  labor  Pt States:   Contractions Frequency: 3         Contraction severity: strong         Fetal activity: +FM  OB History    Gravida Para Term Preterm AB TAB SAB Ectopic Multiple Living   0     Past Medical History  Diagnosis Date  . Depression   . Headache   . ADHD (attention deficit hyperactivity disorder)   . Anemia    Past Surgical History  Procedure Laterality Date  . No past surgeries     Family History: family history includes Asthma in her maternal grandmother and sister; Depression in her sister; Drug abuse in her other. There is no history of Alcohol abuse, Arthritis, Birth defects, Cancer, COPD, Diabetes, Hypertension, Hyperlipidemia, Hearing loss, Early death, Heart disease,  Kidney disease, Mental illness, Mental retardation, Learning disabilities, Miscarriages / Stillbirths, Stroke, Vision loss, or Varicose Veins. Social History:  reports that she quit smoking about 7 months ago. She has never used smokeless tobacco. She reports that she uses illicit drugs (Marijuana). She reports that she does not drink alcohol.   Prenatal Transfer Tool  Maternal Diabetes: No Genetic Screening: Normal Maternal Ultrasounds/Referrals: Abnormal:  Findings:   Fetal renal pyelectasis  - resolved  Fetal Ultrasounds or other Referrals:  None, Referred to Materal Fetal Medicine  Maternal Substance Abuse:  Yes:  Type: Marijuana, stopped the day she found out she was pregnant Significant Maternal Medications:  None Significant Maternal Lab Results: None   ROS:  See HPI above, all other systems are negative  No Known Allergies  Dilation: 3 Effacement (%): 90 Station: -1 Exam by:: Starlina Lapre, CNM Blood pressure 129/84, pulse 79, temperature 97.9 F (36.6 C), temperature source Oral, resp. rate 18, last menstrual period 11/24/2013.  Maternal Exam:  Uterine Assessment: Contraction frequency is rare.  Abdomen: Gravid, non tender. Fundal height is aga.  Normal external genitalia, vulva, cervix, uterus and adnexa.  No lesions noted on exam.  Pelvis adequate for delivery.  Fetal presentation: Vertex by VE  Fetal Exam:  Monitor Surveillance : Continuous Monitoring  Mode: Ultrasound.  NICHD: Category CTXs: Q 3-204minutes EFW   7 lbs  Physical Exam: Nursing note and vitals reviewed General: alert and cooperative She appears well nourished Psychiatric: Normal mood and affect. Her behavior is normal Head: Normocephalic Eyes: Pupils are equal, round, and reactive to light Neck: Normal range of motion Cardiovascular: RRR without murmur  Respiratory: CTAB. Effort normal  Abd: soft, non-tender, +BS, no rebound, no guarding  Genitourinary: Vagina normal  Neurological:  A&Ox3 Skin: Warm and dry  Musculoskeletal: Normal range of motion  Homan's sign negative bilaterally No evidence of DVTs.  Edema: Minimal bilaterally non-pitting edema DTR: 2+ Clonus: None   Prenatal labs: ABO, Rh: B/Positive/-- (08/24 0000) Antibody: Negative (08/24 0000) Rubella:    RPR: Nonreactive (11/10 0000)  HBsAg: Negative (08/24 0000)  HIV: Non-reactive (08/24 0000)  GBS: Negative (01/05 0000) Sickle cell/Hgb electrophoresis:  WNL Pap:   GC:    Chlamydia:  Genetic screenings:  wnl Glucola:  wnl  Assessment:  IUP at 40.6 weeks NICHD: Category Membranes:intact Bishop Score:6 GBS negative  Plan:  Admit to L&D for expectant/active management of labor. Possible augmentation options reviewed including AROM and/or pitocin.   IV pain medication per orders PRN Epidural per patient request Foley cath after patient is comfortable with epidural Anticipate SVD  Labor mgmt as ordered  Okay to ambulate around unit with wireless monitors  Okay to get up and shower without monitoring      Attending MD available at all times.  Angelisse Riso, CNM, MSN 08/27/2014, 3:58 PM       All information will be confirmed upon admisson

## 2014-08-27 NOTE — Telephone Encounter (Signed)
Preadmission screen  

## 2014-08-27 NOTE — MAU Note (Signed)
Pt uncomfortable with contractions.  Was just at office, sent for further eval

## 2014-08-27 NOTE — Anesthesia Preprocedure Evaluation (Signed)
Anesthesia Evaluation  Patient identified by MRN, date of birth, ID band Patient awake    Reviewed: Allergy & Precautions, NPO status , Patient's Chart, lab work & pertinent test results  Airway Mallampati: II  TM Distance: >3 FB Neck ROM: Full    Dental no notable dental hx. (+) Teeth Intact   Pulmonary former smoker,  breath sounds clear to auscultation  Pulmonary exam normal       Cardiovascular negative cardio ROS  Rhythm:Regular Rate:Normal     Neuro/Psych  Headaches, PSYCHIATRIC DISORDERS Depression ADHD   GI/Hepatic Neg liver ROS, GERD-  ,  Endo/Other  negative endocrine ROS  Renal/GU negative Renal ROS  negative genitourinary   Musculoskeletal negative musculoskeletal ROS (+)   Abdominal   Peds  Hematology  (+) anemia ,   Anesthesia Other Findings   Reproductive/Obstetrics (+) Pregnancy                             Anesthesia Physical Anesthesia Plan  ASA: II  Anesthesia Plan: Epidural   Post-op Pain Management:    Induction:   Airway Management Planned: Natural Airway  Additional Equipment:   Intra-op Plan:   Post-operative Plan:   Informed Consent: I have reviewed the patients History and Physical, chart, labs and discussed the procedure including the risks, benefits and alternatives for the proposed anesthesia with the patient or authorized representative who has indicated his/her understanding and acceptance.     Plan Discussed with: Anesthesiologist and CRNA  Anesthesia Plan Comments:         Anesthesia Quick Evaluation

## 2014-08-28 ENCOUNTER — Encounter (HOSPITAL_COMMUNITY): Payer: Self-pay | Admitting: *Deleted

## 2014-08-28 ENCOUNTER — Inpatient Hospital Stay (HOSPITAL_COMMUNITY): Admission: RE | Admit: 2014-08-28 | Payer: Medicaid Other | Source: Ambulatory Visit

## 2014-08-28 LAB — CBC
HEMATOCRIT: 28.9 % — AB (ref 36.0–46.0)
Hemoglobin: 10.4 g/dL — ABNORMAL LOW (ref 12.0–15.0)
MCH: 30.2 pg (ref 26.0–34.0)
MCHC: 36 g/dL (ref 30.0–36.0)
MCV: 84 fL (ref 78.0–100.0)
PLATELETS: 173 10*3/uL (ref 150–400)
RBC: 3.44 MIL/uL — ABNORMAL LOW (ref 3.87–5.11)
RDW: 13.4 % (ref 11.5–15.5)
WBC: 16.6 10*3/uL — ABNORMAL HIGH (ref 4.0–10.5)

## 2014-08-28 LAB — RPR: RPR: NONREACTIVE

## 2014-08-28 LAB — HIV ANTIBODY (ROUTINE TESTING W REFLEX): HIV Screen 4th Generation wRfx: NONREACTIVE

## 2014-08-28 LAB — GC/CHLAMYDIA PROBE AMP (~~LOC~~) NOT AT ARMC
Chlamydia: NEGATIVE
NEISSERIA GONORRHEA: NEGATIVE

## 2014-08-28 MED ORDER — IBUPROFEN 600 MG PO TABS
600.0000 mg | ORAL_TABLET | Freq: Four times a day (QID) | ORAL | Status: DC
  Administered 2014-08-28 – 2014-08-30 (×9): 600 mg via ORAL
  Filled 2014-08-28 (×7): qty 1

## 2014-08-28 MED ORDER — DIPHENHYDRAMINE HCL 25 MG PO CAPS: 25.0000 mg | ORAL_CAPSULE | Freq: Four times a day (QID) | ORAL | Status: DC | PRN

## 2014-08-28 MED ORDER — ONDANSETRON HCL 4 MG/2ML IJ SOLN: 4.0000 mg | INTRAMUSCULAR | Status: DC | PRN

## 2014-08-28 MED ORDER — PRENATAL MULTIVITAMIN CH
1.0000 | ORAL_TABLET | Freq: Every day | ORAL | Status: DC
  Administered 2014-08-28 – 2014-08-30 (×3): 1 via ORAL
  Filled 2014-08-28 (×3): qty 1

## 2014-08-28 MED ORDER — ONDANSETRON HCL 4 MG PO TABS
4.0000 mg | ORAL_TABLET | ORAL | Status: DC | PRN
Start: 2014-08-28 — End: 2014-08-30

## 2014-08-28 MED ORDER — SENNOSIDES-DOCUSATE SODIUM 8.6-50 MG PO TABS
2.0000 | ORAL_TABLET | ORAL | Status: DC
  Administered 2014-08-29 (×2): 2 via ORAL
  Filled 2014-08-28: qty 2

## 2014-08-28 MED ORDER — SIMETHICONE 80 MG PO CHEW: 80.0000 mg | CHEWABLE_TABLET | ORAL | Status: DC | PRN

## 2014-08-28 MED ORDER — ZOLPIDEM TARTRATE 5 MG PO TABS: 5.0000 mg | ORAL_TABLET | Freq: Every evening | ORAL | Status: DC | PRN

## 2014-08-28 MED ORDER — WITCH HAZEL-GLYCERIN EX PADS: 1.0000 "application " | MEDICATED_PAD | CUTANEOUS | Status: DC | PRN

## 2014-08-28 MED ORDER — TETANUS-DIPHTH-ACELL PERTUSSIS 5-2.5-18.5 LF-MCG/0.5 IM SUSP: 0.5000 mL | Freq: Once | INTRAMUSCULAR | Status: DC

## 2014-08-28 MED ORDER — OXYCODONE-ACETAMINOPHEN 5-325 MG PO TABS: 2.0000 | ORAL_TABLET | ORAL | Status: DC | PRN

## 2014-08-28 MED ORDER — LANOLIN HYDROUS EX OINT: TOPICAL_OINTMENT | CUTANEOUS | Status: DC | PRN

## 2014-08-28 MED ORDER — OXYCODONE-ACETAMINOPHEN 5-325 MG PO TABS
1.0000 | ORAL_TABLET | ORAL | Status: DC | PRN
  Administered 2014-08-28 (×3): 1 via ORAL
  Filled 2014-08-28 (×3): qty 1

## 2014-08-28 MED ORDER — BENZOCAINE-MENTHOL 20-0.5 % EX AERO
1.0000 "application " | INHALATION_SPRAY | CUTANEOUS | Status: DC | PRN
  Administered 2014-08-28: 1 via TOPICAL
  Filled 2014-08-28: qty 56

## 2014-08-28 MED ORDER — DIBUCAINE 1 % RE OINT: 1.0000 "application " | TOPICAL_OINTMENT | RECTAL | Status: DC | PRN

## 2014-08-28 NOTE — Anesthesia Postprocedure Evaluation (Signed)
  Anesthesia Post-op Note  Patient: [name]  Procedure(s) Performed: * No procedures listed *  Patient Location: Mother/Baby  Anesthesia Type:Epidural  Level of Consciousness: awake  Airway and Oxygen Therapy: Patient Spontanous Breathing  Post-op Pain: mild  Post-op Assessment: Patient's Cardiovascular Status Stable and Respiratory Function Stable  Post-op Vital Signs: stable  Last Vitals:  Filed Vitals:   08/28/14 1000  BP: 108/68  Pulse: 81  Temp: 36.9 C  Resp: 18    Complications: No apparent anesthesia complications

## 2014-08-28 NOTE — Progress Notes (Signed)
[name]  Post Partum Day 0:S/P SVD with Bilateral Labial Lacerations  Subjective: Patient up ad lib, denies syncope or dizziness. Reports consuming regular diet without issues and denies N/V. Denies issues with urination and reports bleeding is "okay."  Patient is breastfeeding and reports going well. Infant currently skin to skin with FOB. Unsure of postpartum contraception method reporting history of depo with good results, but "the goverment is trying to sterilize the [place] with this, so I need to find another method."  Pain is being appropriately managed with use of percocet and ibuprofen.  Objective: Filed Vitals:   08/28/14 0430 08/28/14 0506 08/28/14 0610 08/28/14 1000  BP: 123/69 113/78 111/68 108/68  Pulse: 93 89 88 81  Temp:  98.6 F (37 C) 98.3 F (36.8 C) 98.5 F (36.9 C)  TempSrc:  Oral Oral Oral  Resp: 18 20 18 18   Height:      Weight:      SpO2:  100% 99%     Recent Labs  08/27/14 1620 08/28/14 0655  HGB 12.5 10.4*  HCT 34.5* 28.9*    Physical Exam:  General: alert, cooperative and no distress Mood/Affect: Coherent/Appropriate Lungs: clear to auscultation, no wheezes, rales or rhonchi, symmetric air entry.  Heart: normal rate and regular rhythm. Abdomen:  + bowel sounds, Soft, NT Uterine Fundus: firm at umbilicus Lochia: appropriate Laceration: Not assessed due to location Skin: Warm, Dry DVT Evaluation: No evidence of DVT seen on physical exam. No cords or calf tenderness.  Assessment S/P Vaginal Delivery-Day Normal Involution BreastFeeding  Plan: Educated regarding appropriate BCM in PPP including condoms and NFP Continue current care Plan for discharge tomorrow Dr. Audree CamelN. Dillard to be updated on patient status   Johnattan Strassman LYNN, MSN, CNM 08/28/2014, 10:59 AM

## 2014-08-28 NOTE — Progress Notes (Signed)
Labor Progress  Subjective: Very comfortable with the epidural  Objective: BP 131/78 mmHg  Pulse 80  Temp(Src) 97.8 F (36.6 C) (Oral)  Resp 18  Ht 5\' 6"  (1.676 m)  Wt 164 lb (74.39 kg)  BMI 26.48 kg/m2  SpO2 100%  LMP 11/24/2013     FHT: 120, moderate variability, + accel, no decel CTX:  regular, every 3-5 minutes Uterus gravid, soft non tender SVE:  Dilation: 6 Effacement (%): 90 Station: 0 Exam by:: Laurajean Hosek CNM  Assessment:  IUP at 40.6 weeks NICHD: Category 1 Membranes:  SROM at 2036 x 1.5hrs, no s/s of infection Labor progress: adquate labor GBS: negaive  Plan: Continue labor plan Continuous monitoring Rest Frequent position changes to facilitate fetal rotation and descent. Will reassess with cervical exam at MN or earlier if necessary       Elyssia Strausser, CNM, MSN 08/28/2014. 12:09 AM

## 2014-08-28 NOTE — Lactation Note (Signed)
This note was copied from the chart of [name]. Lactation Consultation Note  Patient Name: [name] [id number]'s Date: 08/28/2014 Reason for consult: Initial assessment  Visited with Mom, baby 2613 hrs old.  Baby has latched on and fed well per Mom, 4 times since birth.  Reviewed basics and answered questions that Mom had.  Baby skin to skin on Mom's chest, sleeping quietly.  Reviewed importance of skin to skin, and feeding often on cue.  Brochure left in room.  Informed Mom of IP and OP lactation services available to her.  To call for help prn, f/u in am.    Consult Status Consult Status: Follow-up Date: 08/29/14 Follow-up type: In-patient    Judee ClaraSmith, Mayleen Borrero E 08/28/2014, 5:15 PM

## 2014-08-28 NOTE — Progress Notes (Signed)
UR chart review completed.  

## 2014-08-28 NOTE — Progress Notes (Signed)
Labor Progress  Subjective: Sleeping, no complaints  Objective: BP 123/88 mmHg  Pulse 85  Temp(Src) 97.8 F (36.6 C) (Oral)  Resp 18  Ht 5\' 6"  (1.676 m)  Wt 164 lb (74.39 kg)  BMI 26.48 kg/m2  SpO2 100%  LMP 11/24/2013     FHT: 120 moderate variability,  + accel, occasional variable decel CTX:  regular, every 3-4nutes Uterus gravid, soft non tender SVE:  9/90/0   Assessment:  IUP at 41.0 weeks NICHD: Category 2 Membranes:  SROM x 4hrs, no s/s of infection Labor progress: adquate labor - transition GBS: negative   Plan: Continue labor plan Continuous monitoring Rest Frequent position changes to facilitate fetal rotation and descent. Will reassess with cervical exam at 0200 or earlier if necessary      Shirle Provencal, CNM, MSN 08/28/2014. 12:28 AM

## 2014-08-29 MED ORDER — IBUPROFEN 600 MG PO TABS: 600.0000 mg | ORAL_TABLET | Freq: Four times a day (QID) | ORAL | Status: DC | PRN

## 2014-08-29 MED ORDER — OXYCODONE-ACETAMINOPHEN 5-325 MG PO TABS: 1.0000 | ORAL_TABLET | ORAL | Status: DC | PRN

## 2014-08-29 NOTE — Discharge Instructions (Signed)

## 2014-08-29 NOTE — Progress Notes (Signed)
[facility] cancel d/c for today due to family issues.  OK with regular d/c day tomorrow. D/C cancelled.  Nigel BridgemanVicki Nayra Coury, CNM 08/29/14 1:30p

## 2014-08-29 NOTE — Discharge Summary (Addendum)
  Vaginal Delivery Discharge Summary  [name]  DOB:    10/15/1980 MRN:    [id number] CSN:    [id number]  Date of admission:                  08/27/14  Date of discharge:                   08/30/14  Procedures this admission:   SVB, repair of superficial bilateral labial and left periurethral lacerations  Date of Delivery: 08/28/14  Newborn Data:  Live born female  Birth Weight: 6 lb 5.6 oz (2880 g) APGAR: 8, 9  Home with mother Name: [name]   History of Present Illness:  Ms. [name] is a 34 y.o. female, [phone], who presents at 3069w0d weeks gestation. The patient has [facility]. She was admitted onset of labor. Her pregnancy has been complicated by:  Patient Active Problem List   Diagnosis Date Noted  . Vaginal delivery 08/28/2014  . Normal labor 08/27/2014  . 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 ADD (attention deficit disorder) 08/26/2014  . Depression 08/26/2014  . H/O abortion x 4 08/26/2014     Hospital Course:  Admitted 08/27/14 in early labor. Negative GBS. Progressed spontaneously. Utilized epidural for pain management.  [facility], CNM, without complication. Patient and baby tolerated the procedure without difficulty, with superficial bilateral labial and left periurethral lacerations noted. Infant status was stable and remained in room with mother.  Mother and infant then had an uncomplicated postpartum course, with breastfeeding going well. Mom's physical exam was WNL, and she was discharged home in stable condition. Contraception plan was undecided.  She received adequate benefit from po pain medications, using Motrin and Percocet.   Feeding:  breast  Contraception:  Undecided at present.  Discharge hemoglobin:  HEMOGLOBIN  Date Value Ref Range Status  08/28/2014 10.4* 12.0 - 15.0 g/dL Final    Comment:    DELTA CHECK  NOTED REPEATED TO VERIFY    HCT  Date Value Ref Range Status  08/28/2014 28.9* 36.0 - 46.0 % Final    Discharge Physical Exam:   General: alert Lochia: appropriate Uterine Fundus: firm Incision: healing well DVT Evaluation: No evidence of DVT seen on physical exam. Negative Homan's sign.  Intrapartum Procedures: spontaneous vaginal delivery Postpartum Procedures: none Complications-Operative and Postpartum: bilateral labial and left periurethral lacerations  Discharge Diagnoses: Term Pregnancy-delivered  Discharge Information:  Activity:           pelvic rest Diet:                routine Medications: Ibuprofen and Percocet Condition:      stable Instructions:     Discharge to: home  Follow-up Information    Follow up with [facility] In 6 weeks.   Specialty:  Obstetrics and Gynecology   Why:  Call for any questions or concerns.   Contact information:   [address] [place] [phone]       Nigel BridgemanLATHAM, Maisie Hauser [facility] 08/30/2014 7:29 AM

## 2014-08-29 NOTE — Lactation Note (Signed)
This note was copied from the chart of [name]. Lactation Consultation Note: follow up visit with mom Baby has not voided since delivery. Mom reports that baby has been nursing well but she is not sure how much she is getting. Assisted with latch. Encouraged mom to massage and compress her breasts during feeding to get as much into the baby as possible. Mom easily able to hand express Colostrum. Mom very pleased- this is the first time she has seen any. Reassurance given. Asking about pump and milk storage. Reviwed Baby and me Book with mom. Has her own Medela pump. No questions at present. To call prn  Patient Name: [name] ZOXWR'UToday's Date: 08/29/2014 Reason for consult: Follow-up assessment   Maternal Data Formula Feeding for Exclusion: Yes Reason for exclusion: Mother's choice to formula and breast feed on admission Has patient been taught Hand Expression?: Yes Does the patient have breastfeeding experience prior to this delivery?: No  Feeding Feeding Type: Breast Fed Length of feed: 30 min  LATCH Score/Interventions Latch: Grasps breast easily, tongue down, lips flanged, rhythmical sucking.  Audible Swallowing: A few with stimulation  Type of Nipple: Everted at rest and after stimulation  Comfort (Breast/Nipple): Soft / non-tender     Hold (Positioning): Assistance needed to correctly position infant at breast and maintain latch.  LATCH Score: 8  Lactation Tools Discussed/Used WIC Program: No   Consult Status Consult Status: Complete    Pamelia HoitWeeks, [place] Jon D 08/29/2014, 11:27 AM

## 2014-08-29 NOTE — Progress Notes (Signed)
Clinical Social Work Department PSYCHOSOCIAL ASSESSMENT - MATERNAL/CHILD 08/29/2014  Patient:  [name], [name]  Account Number:  [id number]  Admit Date:  08/27/2014  Ardine Eng Name:   [facility]   Clinical Social Worker:  Lucita Ferrara, CLINICAL SOCIAL WORKER   Date/Time:  08/29/2014 09:00 AM  Date Referred:  08/28/2014   Referral source  Central [facility]     Referred reason  Depression/Anxiety   Other referral source:    I:  FAMILY / HOME ENVIRONMENT Child's legal guardian:  PARENT  Guardian - Name Guardian - Age Guardian - Address  Jazara Swiney [place], [place]  same residence as MOB   Other household support members/support persons Other support:   MOB and FOB endorsed strong family support.    II  PSYCHOSOCIAL DATA Information Source:  Patient Interview  Occupational hygienist Employment:   MOB stated that she is currently not working. She stated that she gradated with a college degree this past semester.   Financial resources:  Medicaid If Medicaid - County:  [place] / Grade:  N/A Music therapist / Child Services Coordination / Early Interventions:   None reported  Cultural issues impacting care:   None reported    III  STRENGTHS Strengths  Adequate Resources  Home prepared for Child (including basic supplies)  Supportive family/friends   Strength comment:    IV  RISK FACTORS AND CURRENT PROBLEMS Current Problem:  YES   Risk Factor & Current Problem Patient Issue Family Issue Risk Factor / Current Problem Comment  Mental Illness Y N MOB presents with history of depression, stemming from "many years ago".  MOB denied recent/acute symptoms.    V  SOCIAL WORK ASSESSMENT CSW met with the MOB due to maternal history of depression.  MOB presented as easily engaged and receptive to the visit.  She was observed to be providing skin to skin with the baby during the entire  visit.  She displayed an appropriate range in affect, presented in a pleasant mood, no acute mental health symptoms noted. MOB presents with insight and self-awareness related to her mental health, and was able to discuss effective ways to self-regulate if she experiences symptoms of depression.    MOB expressed excitement secondary to her transition to motherhood. She shared that it was an unexpected role transition since she never thought she could have children.  She acknowledged that she did experience symptoms of depression (feeling overwhelmed by thought of new role) when she first learned of pregnancy since she was in school and had other "life plans".  She discussed how she was able to cognitively transition/prepare during the pregnancy, which has assisted her to become excited.  MOB stated that she originally thought she was going to be unable to finish her bachelors degree because of the pregnancy, but stated that she successfully graduated in December. MOB reported that she has noted a pattern of needing to give herself a project to "better myself" when she begins to feel depressed.  She reflected upon previous periods of depression and how she has been able to reduce symptoms by "doing something about it", including going back to school and furthering her education.  She discussed awareness that she is able to confront her symptoms or "wallow in it".  MOB presented with insight that "wallowing" in depression is maladaptive coping and often causes depression to worsen.     MOB presented with awareness of  increased risk for developing postpartum depression.  She stated that she has a strong positive support system that she can utilize in the event that she experiences symptoms. MOB discussed intention/movivation to engage in coping skills and to talk to her MD if she notes symptoms of postpartum depression.   MOB denied additional questions or concerns.  She requested CSW contact information in the  event that she has questions in the postpartum period, CSW provided.  No barriers to discharge.   VI SOCIAL WORK PLAN Social Work Therapist, art  No Further Intervention Required / No Barriers to Discharge   Type of pt/family education:   Postpartum depression   If child protective services report - county:  N/A If child protective services report - date:  N/A Information/referral to community resources comment:   No referrals needed.  MOB reported intention to follow up with her MD if she notes symptoms of postpartum depression.   Other social work plan:   CSW to follow up as needed.

## 2014-08-30 NOTE — Lactation Note (Signed)
This note was copied from the chart of [name]. Lactation Consultation Note Requesting assistance in seting up and cleaning DEBP pt. Had bought. Noted good latch to breast. Demonstrated and pt. Provided teach back. Patient Name: [name] ZOXWR'UToday's Date: 08/30/2014 Reason for consult: Follow-up assessment   Maternal Data    Feeding Feeding Type: Breast Fed Length of feed: 1 min  LATCH Score/Interventions Latch: Grasps breast easily, tongue down, lips flanged, rhythmical sucking.  Audible Swallowing: Spontaneous and intermittent Intervention(s): Alternate breast massage;Hand expression  Type of Nipple: Everted at rest and after stimulation  Comfort (Breast/Nipple): Soft / non-tender     Hold (Positioning): No assistance needed to correctly position infant at breast.  LATCH Score: 10  Lactation Tools Discussed/Used Tools: Pump Breast pump type: Double-Electric Breast Pump Pump Review: Setup, frequency, and cleaning;Milk Storage Initiated by:: Peri JeffersonL. Alden Feagan RN Date initiated:: 08/30/14   Consult Status Date: 08/30/14    Charyl DancerARVER, Ilee Randleman G 08/30/2014, 1:02 PM

## 2015-07-26 NOTE — L&D Delivery Note (Signed)
35 y.o. [name] at 6093w5d delivered a viable female infant in cephalic, LOA position. Tight nuchal cord x1, delivered through with ease. Right anterior shoulder delivered with ease. 60 sec delayed cord clamping. Cord clamped x2 and cut. Placenta delivered spontaneously intact, with 3VC. Fundus firm on exam with massage and pitocin. Good hemostasis noted.  Laceration: None Suture: N/A Good hemostasis noted. EBL: 200cc  Mom and baby recovering in LDR.    Apgars: 8/9 Weight: 3115g, 6#13.9oz    Jen MowElizabeth Elita Dame, DO OB Fellow [facility] 07/16/2016, 5:38 PM

## 2016-02-01 LAB — OB RESULTS CONSOLE GC/CHLAMYDIA
Chlamydia: NEGATIVE
GC PROBE AMP, GENITAL: NEGATIVE

## 2016-02-01 LAB — OB RESULTS CONSOLE HEPATITIS B SURFACE ANTIGEN: Hepatitis B Surface Ag: NEGATIVE

## 2016-02-01 LAB — OB RESULTS CONSOLE HIV ANTIBODY (ROUTINE TESTING): HIV: NONREACTIVE

## 2016-02-01 LAB — OB RESULTS CONSOLE ABO/RH: RH Type: POSITIVE

## 2016-02-01 LAB — OB RESULTS CONSOLE RUBELLA ANTIBODY, IGM: Rubella: IMMUNE

## 2016-02-01 LAB — OB RESULTS CONSOLE RPR: RPR: NONREACTIVE

## 2016-02-01 LAB — OB RESULTS CONSOLE ANTIBODY SCREEN: ANTIBODY SCREEN: NEGATIVE

## 2016-02-11 ENCOUNTER — Other Ambulatory Visit (HOSPITAL_COMMUNITY): Payer: Self-pay | Admitting: Nurse Practitioner

## 2016-02-11 ENCOUNTER — Ambulatory Visit (HOSPITAL_COMMUNITY)
Admission: RE | Admit: 2016-02-11 | Discharge: 2016-02-11 | Disposition: A | Discharge status: Home / Self Care | Payer: Medicaid Other | Source: Ambulatory Visit | Attending: Nurse Practitioner | Admitting: Nurse Practitioner

## 2016-02-11 DIAGNOSIS — Z3689 Encounter for other specified antenatal screening: Secondary | ICD-10-CM

## 2016-02-11 DIAGNOSIS — Z3A17 17 weeks gestation of pregnancy: Secondary | ICD-10-CM

## 2016-02-11 DIAGNOSIS — O09512 Supervision of elderly primigravida, second trimester: Secondary | ICD-10-CM

## 2016-02-11 DIAGNOSIS — O09529 Supervision of elderly multigravida, unspecified trimester: Secondary | ICD-10-CM

## 2016-02-12 ENCOUNTER — Encounter (HOSPITAL_COMMUNITY): Payer: Self-pay

## 2016-02-12 DIAGNOSIS — O09529 Supervision of elderly multigravida, unspecified trimester: Secondary | ICD-10-CM | POA: Insufficient documentation

## 2016-02-12 NOTE — Progress Notes (Signed)
Genetic Counseling  High-Risk Gestation Note  Appointment Date:  02/11/16 Referred By: Trina AoBaker, Sandra K, NP Date of Birth:  04/27/1981   Pregnancy History: [id number] Estimated Date of Delivery: 07/18/16 Estimated Gestational Age: 558w3d Attending: Eulis FosterKristen Quinn, MD   Ms. [name] was seen for genetic counseling because of a maternal age of 35 y.o.. She will be 35 years old at delivery.     In summary:  Discussed AMA and associated risk for fetal aneuploidy  Reviewed Quad screen results  Reduced risks for Down syndrome (1 in 3,502)  Trisomy 18 risk increased to 1 in 440 but still in screen negative range  AFP borderline elevated (2.33 MoM)-technically screen negative for ONTD risk  Discussed options for additional screening  NIPS- declined  Ultrasound- scheduled for 02/18/16  Discussed diagnostic testing options  Amniocentesis-declined  Reviewed family history concerns  Patient reported sickle cell trait for herself and daughter (med records indicate Hemoglobin C trait for patient)  Reviewed autosomal recessive inheritance of hemoglobinopathies  Carrier screening available to the father of the pregnancy  Patient stated she would not consider prenatal diagnosis for these conditions, if they were identified to be a carrier couple  Patient is comfortable with newborn screening for hemoglobinopathies  Discussed carrier screening options  CF-declined  SMA-declined  She was counseled regarding maternal age and the association with risk for chromosome conditions due to nondisjunction with aging of the ova.   We reviewed chromosomes, nondisjunction, and the associated 1 in 141 risk for fetal aneuploidy at 3058w3d gestation related to a maternal age of 35 years old at delivery.  She was/They were counseled that the risk for aneuploidy decreases as gestational age increases, accounting for those pregnancies which spontaneously abort.  We specifically discussed Down syndrome  (trisomy 2421), trisomies 5813 and 8218, and sex chromosome aneuploidies (47,XXX and 47,XXY) including the common features and prognoses of each.   Ms. Shari RoughHicks previously had Quad screen [facility]. We reviewed these results, which were within normal range for the conditions screened.  She was counseled that screening tests are used to modify a patient's a priori risk for aneuploidy, typically based on age. This estimate provides a pregnancy specific risk assessment. Quad screen reduced the risk for fetal Down syndrome from 1 in 410 to 1 in 3502. The risk for fetal Trisomy 18 was increased from 1 in 1231 to 1 in 440; however, we reviewed that this was below the screen's cutoff. Additionally, the screen reported a borderline elevation of MSAFP (2.32 MoM). We discussed that this is considered within range and a screen negative result for ONTDs but warrants a detailed ultrasound. We reviewed the detection rates of this screen. She understands that Quad screen does not diagnose or rule out these conditions and does not assess for all chromosome conditions.   We reviewed additional available screening options including noninvasive prenatal screening (NIPS)/cell free DNA (cfDNA) screening and detailed ultrasound.  We reviewed the benefits and limitations of each option. Specifically, we discussed the conditions for which each test screens, the detection rates, and false positive rates of each. She was also counseled regarding diagnostic testing via amniocentesis. We reviewed the approximate 1 in 300-500 risk for complications from amniocentesis, including spontaneous pregnancy loss. We discussed the possible results that the tests might provide including: positive, negative, unanticipated, and no result. Finally, they were counseled regarding the cost of each option and potential out of pocket expenses. After consideration of all the options, she elected to  proceed with detailed  ultrasound only, which is scheduled for 02/18/16. She declined NIPS and amniocentesis.   She understands that screening tests cannot rule out all birth defects or genetic syndromes. The patient was advised of this limitation and states she still does not want additional testing at this time.   Ms. [name] was provided with written information regarding cystic fibrosis (CF), spinal muscular atrophy (SMA) and hemoglobinopathies including the carrier frequency, availability of carrier screening and prenatal diagnosis if indicated.  In addition, we discussed that CF and hemoglobinopathies are routinely screened for as part of the [facility] newborn screening panel.  After further discussion, she declined screening for CF and SMA.   Ms. [name] reported that she has previously had screening for hemoglobin variants and has sickle cell trait, as does the couple's daughter. 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 She is unsure if the father of the pregnancy has had screening. He has no known family history of sickle cell anemia or sickle cell trait, has reported African American ancestry, and consanguinity was denied for the couple.   We discussed that sickle cell anemia (SCA) affects the shape and function of the red blood cell by producing abnormal hemoglobin. They were counseled that hemoglobin is a protein in the RBCs that carries oxygen to the body's organs. Individuals who have SCA have a changes within the genes that codes for hemoglobin. This couple was counseled that SCA is inherited in an autosomal recessive manner, and occurs when both copies of the hemoglobin gene are changed and produce an abnormal hemoglobin S. We discussed that other hemoglobin variants (hemoglobin C), when combined with hemoglobin S, can cause a medically significant hemoglobinopathy.  Carriers of recessive conditions typically do not have  symptoms related to the condition because they still have one functioning copy of the gene, and thus some production of the typical protein coded for by that gene.  Given the recessive inheritance, we discussed the importance of understanding the carrier status of the father of the pregnancy in order to accurately predict the risk of a hemoglobinopathy in the fetus. The father of the pregnancy has the option of testing (hemoglobin electrophoresis) to determine whether he has any hemoglobin variant, including SCT.  They understand that an accurate risk assessment cannot be performed without further information. In the case that both parents were identified to be carriers, prenatal diagnosis would be available. Ms. [name] indicated she would not be interested in prenatal diagnosis via amniocentesis for a hemoglobinopathy. We also reviewed the availability of newborn screening in [place] for hemoglobinopathies.   Both family histories were reviewed and found to be otherwise noncontributory for birth defects, intellectual disability, and known genetic conditions. Ms. [name] reported somewhat limited information about her extended family, given that she was adopted. Without further information regarding the provided family history, an accurate genetic risk cannot be calculated. Further genetic counseling is warranted if more information is obtained.  Ms. [name] denied exposure to environmental toxins or chemical agents. She denied the use of alcohol, tobacco or street drugs. She denied significant viral illnesses during the course of her pregnancy. Her medical and surgical histories were noncontributory.   I counseled Ms. [name] regarding the above risks and available options.  The approximate face-to-face time with the genetic counselor was 45 minutes.  Dominguez Plowman, MS,  Certified Genetic Counselor 02/12/2016

## 2016-02-15 ENCOUNTER — Other Ambulatory Visit (HOSPITAL_COMMUNITY): Payer: Self-pay

## 2016-02-15 DIAGNOSIS — Z3A17 17 weeks gestation of pregnancy: Secondary | ICD-10-CM | POA: Insufficient documentation

## 2016-02-18 ENCOUNTER — Encounter (HOSPITAL_COMMUNITY): Payer: Self-pay

## 2016-02-18 ENCOUNTER — Ambulatory Visit (HOSPITAL_COMMUNITY)
Admission: RE | Admit: 2016-02-18 | Discharge: 2016-02-18 | Disposition: A | Discharge status: Home / Self Care | Payer: Medicaid Other | Source: Ambulatory Visit | Attending: Nurse Practitioner | Admitting: Nurse Practitioner

## 2016-02-18 DIAGNOSIS — O09522 Supervision of elderly multigravida, second trimester: Secondary | ICD-10-CM | POA: Diagnosis present

## 2016-02-18 DIAGNOSIS — O09529 Supervision of elderly multigravida, unspecified trimester: Secondary | ICD-10-CM

## 2016-02-18 DIAGNOSIS — O283 Abnormal ultrasonic finding on antenatal screening of mother: Secondary | ICD-10-CM | POA: Insufficient documentation

## 2016-02-18 DIAGNOSIS — Z3A18 18 weeks gestation of pregnancy: Secondary | ICD-10-CM | POA: Insufficient documentation

## 2016-06-08 ENCOUNTER — Ambulatory Visit (HOSPITAL_COMMUNITY): Payer: Medicaid Other

## 2016-06-08 ENCOUNTER — Encounter (HOSPITAL_COMMUNITY): Payer: Self-pay

## 2016-06-21 LAB — OB RESULTS CONSOLE GBS: GBS: NEGATIVE

## 2016-06-28 ENCOUNTER — Inpatient Hospital Stay (HOSPITAL_COMMUNITY)
Admission: AD | Admit: 2016-06-28 | Discharge: 2016-06-28 | Disposition: A | Discharge status: Home / Self Care | Payer: Medicaid Other | Source: Ambulatory Visit | Attending: Obstetrics & Gynecology | Admitting: Obstetrics & Gynecology

## 2016-06-28 ENCOUNTER — Encounter (HOSPITAL_COMMUNITY): Payer: Self-pay

## 2016-06-28 ENCOUNTER — Telehealth (HOSPITAL_COMMUNITY): Payer: Self-pay | Admitting: *Deleted

## 2016-06-28 DIAGNOSIS — Z3A37 37 weeks gestation of pregnancy: Secondary | ICD-10-CM | POA: Insufficient documentation

## 2016-06-28 DIAGNOSIS — Z3493 Encounter for supervision of normal pregnancy, unspecified, third trimester: Secondary | ICD-10-CM | POA: Diagnosis present

## 2016-06-28 DIAGNOSIS — O09523 Supervision of elderly multigravida, third trimester: Secondary | ICD-10-CM

## 2016-06-28 NOTE — MAU Note (Signed)
Pt presents complaining of contractions x1 hour. Denies leaking or bleeding. Reports good fetal movement.

## 2016-06-28 NOTE — Discharge Instructions (Signed)
Introduction °Patient Name: ________________________________________________ Patient Due Date: ____________________ °What is a fetal movement count? °A fetal movement count is the number of times that you feel your baby move during a certain amount of time. This may also be called a fetal kick count. A fetal movement count is recommended for every pregnant woman. You may be asked to start counting fetal movements as early as week 28 of your pregnancy. °Pay attention to when your baby is most active. You may notice your baby's sleep and wake cycles. You may also notice things that make your baby move more. You should do a fetal movement count: °· When your baby is normally most active. °· At the same time each day. °A good time to count movements is while you are resting, after having something to eat and drink. °How do I count fetal movements? °1. Find a quiet, comfortable area. Sit, or lie down on your side. °2. Write down the date, the start time and stop time, and the number of movements that you felt between those two times. Take this information with you to your health care visits. °3. For 2 hours, count kicks, flutters, swishes, rolls, and jabs. You should feel at least 10 movements during 2 hours. °4. You may stop counting after you have felt 10 movements. °5. If you do not feel 10 movements in 2 hours, have something to eat and drink. Then, keep resting and counting for 1 hour. If you feel at least 4 movements during that hour, you may stop counting. °Contact a health care provider if: °· You feel fewer than 4 movements in 2 hours. °· Your baby is not moving like he or she usually does. °Date: ____________ Start time: ____________ Stop time: ____________ Movements: ____________ °Date: ____________ Start time: ____________ Stop time: ____________ Movements: ____________ °Date: ____________ Start time: ____________ Stop time: ____________ Movements: ____________ °Date: ____________ Start time: ____________  Stop time: ____________ Movements: ____________ °Date: ____________ Start time: ____________ Stop time: ____________ Movements: ____________ °Date: ____________ Start time: ____________ Stop time: ____________ Movements: ____________ °Date: ____________ Start time: ____________ Stop time: ____________ Movements: ____________ °Date: ____________ Start time: ____________ Stop time: ____________ Movements: ____________ °Date: ____________ Start time: ____________ Stop time: ____________ Movements: ____________ °This information is not intended to replace advice given to you by your health care provider. Make sure you discuss any questions you have with your health care provider. °Document Released: 08/10/2006 Document Revised: 03/09/2016 Document Reviewed: 08/20/2015 °[employer] Interactive Patient Education © 2017 [employer] °Vaginal Delivery °Vaginal delivery means that you will give birth by pushing your baby out of your birth canal (vagina). A team of health care providers will help you before, during, and after vaginal delivery. Birth experiences are unique for every woman and every pregnancy, and birth experiences vary depending on where you choose to give birth. °What should I do to prepare for my baby's birth? °Before your baby is born, it is important to talk with your health care provider about: °· Your labor and delivery preferences. These may include: °¨ Medicines that you may be given. °¨ How you will manage your pain. This might include non-medical pain relief techniques or injectable pain relief such as epidural analgesia. °¨ How you and your baby will be monitored during labor and delivery. °¨ Who may be in the labor and delivery room with you. °¨ Your feelings about surgical delivery of your baby (cesarean delivery, or C-section) if this becomes necessary. °¨ Your feelings about receiving donated blood through an   IV tube (blood transfusion) if this becomes necessary. °· Whether you are able: °¨ To  take pictures or videos of the birth. °¨ To eat during labor and delivery. °¨ To move around, walk, or change positions during labor and delivery. °· What to expect after your baby is born, such as: °¨ Whether delayed umbilical cord clamping and cutting is offered. °¨ Who will care for your baby right after birth. °¨ Medicines or tests that may be recommended for your baby. °¨ Whether breastfeeding is supported in your hospital or birth center. °¨ How long you will be in the hospital or birth center. °· How any medical conditions you have may affect your baby or your labor and delivery experience. °To prepare for your baby's birth, you should also: °· Attend all of your health care visits before delivery (prenatal visits) as recommended by your health care provider. This is important. °· Prepare your home for your baby's arrival. Make sure that you have: °¨ Diapers. °¨ Baby clothing. °¨ Feeding equipment. °¨ Safe sleeping arrangements for you and your baby. °· Install a car seat in your vehicle. Have your car seat checked by a certified car seat installer to make sure that it is installed safely. °· Think about who will help you with your new baby at home for at least the first several weeks after delivery. °What can I expect when I arrive at the birth center or hospital? °Once you are in labor and have been admitted into the hospital or birth center, your health care provider may: °· Review your pregnancy history and any concerns you have. °· Insert an IV tube into one of your veins. This is used to give you fluids and medicines. °· Check your blood pressure, pulse, temperature, and heart rate (vital signs). °· Check whether your bag of water (amniotic sac) has broken (ruptured). °· Talk with you about your birth plan and discuss pain control options. °Monitoring °Your health care provider may monitor your contractions (uterine monitoring) and your baby's heart rate (fetal monitoring). You may need to be  monitored: °· Often, but not continuously (intermittently). °· All the time or for long periods at a time (continuously). Continuous monitoring may be needed if: °¨ You are taking certain medicines, such as medicine to relieve pain or make your contractions stronger. °¨ You have pregnancy or labor complications. °Monitoring may be done by: °· Placing a special stethoscope or a handheld monitoring device on your abdomen to check your baby's heartbeat, and feeling your abdomen for contractions. This method of monitoring does not continuously record your baby's heartbeat or your contractions. °· Placing monitors on your abdomen (external monitors) to record your baby's heartbeat and the frequency and length of contractions. You may not have to wear external monitors all the time. °· Placing monitors inside of your uterus (internal monitors) to record your baby's heartbeat and the frequency, length, and strength of your contractions. °¨ Your health care provider may use internal monitors if he or she needs more information about the strength of your contractions or your baby's heart rate. °¨ Internal monitors are put in place by passing a thin, flexible wire through your vagina and into your uterus. Depending on the type of monitor, it may remain in your uterus or on your baby's head until birth. °¨ Your health care provider will discuss the benefits and risks of internal monitoring with you and will ask for your permission before inserting the monitors. °· Telemetry. This is a   type of continuous monitoring that can be done with external or internal monitors. Instead of having to stay in bed, you are able to move around during telemetry. Ask your health care provider if telemetry is an option for you. °Physical exam °Your health care provider may perform a physical exam. This may include: °· Checking whether your baby is positioned: °¨ With the head toward your vagina (head-down). This is most common. °¨ With the head  toward the top of your uterus (head-up or breech). If your baby is in a breech position, your health care provider may try to turn your baby to a head-down position so you can deliver vaginally. If it does not seem that your baby can be born vaginally, your provider may recommend surgery to deliver your baby. In rare cases, you may be able to deliver vaginally if your baby is head-up (breech delivery). °¨ Lying sideways (transverse). Babies that are lying sideways cannot be delivered vaginally. °· Checking your cervix to determine: °¨ Whether it is thinning out (effacing). °¨ Whether it is opening up (dilating). °¨ How low your baby has moved into your birth canal. °What are the three stages of labor and delivery? °  °Normal labor and delivery is divided into the following three stages: °Stage 1 °· Stage 1 is the longest stage of labor, and it can last for hours or days. Stage 1 includes: °¨ Early labor. This is when contractions may be irregular, or regular and mild. Generally, early labor contractions are more than 10 minutes apart. °¨ Active labor. This is when contractions get longer, more regular, more frequent, and more intense. °¨ The transition phase. This is when contractions happen very close together, are very intense, and may last longer than during any other part of labor. °· Contractions generally feel mild, infrequent, and irregular at first. They get stronger, more frequent (about every 2-3 minutes), and more regular as you progress from early labor through active labor and transition. °· Many women progress through stage 1 naturally, but you may need help to continue making progress. If this happens, your health care provider may talk with you about: °¨ Rupturing your amniotic sac if it has not ruptured yet. °¨ Giving you medicine to help make your contractions stronger and more frequent. °· Stage 1 ends when your cervix is completely dilated to 4 inches (10 cm) and completely effaced. This happens  at the end of the transition phase. °Stage 2 °· Once your cervix is completely effaced and dilated to 4 inches (10 cm), you may start to feel an urge to push. It is common for the body to naturally take a rest before feeling the urge to push, especially if you received an epidural or certain other pain medicines. This rest period may last for up to 1-2 hours, depending on your unique labor experience. °· During stage 2, contractions are generally less painful, because pushing helps relieve contraction pain. Instead of contraction pain, you may feel stretching and burning pain, especially when the widest part of your baby's head passes through the vaginal opening (crowning). °· Your health care provider will closely monitor your pushing progress and your baby's progress through the vagina during stage 2. °· Your health care provider may massage the area of skin between your vaginal opening and anus (perineum) or apply warm compresses to your perineum. This helps it stretch as the baby's head starts to crown, which can help prevent perineal tearing. °¨ In some cases, an incision may   be made in your perineum (episiotomy) to allow the baby to pass through the vaginal opening. An episiotomy helps to make the opening of the vagina larger to allow more room for the baby to fit through. °· It is very important to breathe and focus so your health care provider can control the delivery of your baby's head. Your health care provider may have you decrease the intensity of your pushing, to help prevent perineal tearing. °· After delivery of your baby's head, the shoulders and the rest of the body generally deliver very quickly and without difficulty. °· Once your baby is delivered, the umbilical cord may be cut right away, or this may be delayed for 1-2 minutes, depending on your baby's health. This may vary among health care providers, hospitals, and birth centers. °· If you and your baby are healthy enough, your baby may be  placed on your chest or abdomen to help maintain the baby's temperature and to help you bond with each other. Some mothers and babies start breastfeeding at this time. Your health care team will dry your baby and help keep your baby warm during this time. °· Your baby may need immediate care if he or she: °¨ Showed signs of distress during labor. °¨ Has a medical condition. °¨ Was born too early (prematurely). °¨ Had a bowel movement before birth (meconium). °¨ Shows signs of difficulty transitioning from being inside the uterus to being outside of the uterus. °If you are planning to breastfeed, your health care team will help you begin a feeding. °Stage 3 °· The third stage of labor starts immediately after the birth of your baby and ends after you deliver the placenta. The placenta is an organ that develops during pregnancy to provide oxygen and nutrients to your baby in the womb. °· Delivering the placenta may require some pushing, and you may have mild contractions. Breastfeeding can stimulate contractions to help you deliver the placenta. °· After the placenta is delivered, your uterus should tighten (contract) and become firm. This helps to stop bleeding in your uterus. To help your uterus contract and to control bleeding, your health care provider may: °¨ Give you medicine by injection, through an IV tube, by mouth, or through your rectum (rectally). °¨ Massage your abdomen or perform a vaginal exam to remove any blood clots that are left in your uterus. °¨ Empty your bladder by placing a thin, flexible tube (catheter) into your bladder. °¨ Encourage you to breastfeed your baby. °After labor is over, you and your baby will be monitored closely to ensure that you are both healthy until you are ready to go home. Your health care team will teach you how to care for yourself and your baby. °This information is not intended to replace advice given to you by your health care provider. Make sure you discuss any  questions you have with your health care provider. °Document Released: 04/19/2008 Document Revised: 01/29/2016 Document Reviewed: 07/26/2015 °[employer] Interactive Patient Education © 2017 [employer] ° °

## 2016-06-28 NOTE — Telephone Encounter (Signed)
Preadmission screen  

## 2016-07-01 ENCOUNTER — Observation Stay (HOSPITAL_COMMUNITY)
Admission: RE | Admit: 2016-07-01 | Discharge: 2016-07-01 | Disposition: A | Discharge status: Home / Self Care | Payer: Medicaid Other | Source: Ambulatory Visit | Attending: Family Medicine | Admitting: Family Medicine

## 2016-07-01 ENCOUNTER — Encounter (HOSPITAL_COMMUNITY): Payer: Self-pay

## 2016-07-01 VITALS — Ht 67.5 in | Wt 177.0 lb

## 2016-07-01 DIAGNOSIS — Z87891 Personal history of nicotine dependence: Secondary | ICD-10-CM | POA: Insufficient documentation

## 2016-07-01 DIAGNOSIS — O321XX0 Maternal care for breech presentation, not applicable or unspecified: Principal | ICD-10-CM

## 2016-07-01 DIAGNOSIS — F909 Attention-deficit hyperactivity disorder, unspecified type: Secondary | ICD-10-CM | POA: Insufficient documentation

## 2016-07-01 DIAGNOSIS — Z3A37 37 weeks gestation of pregnancy: Secondary | ICD-10-CM | POA: Diagnosis not present

## 2016-07-01 DIAGNOSIS — O09523 Supervision of elderly multigravida, third trimester: Secondary | ICD-10-CM

## 2016-07-01 DIAGNOSIS — O99343 Other mental disorders complicating pregnancy, third trimester: Secondary | ICD-10-CM | POA: Insufficient documentation

## 2016-07-01 DIAGNOSIS — D649 Anemia, unspecified: Secondary | ICD-10-CM | POA: Diagnosis not present

## 2016-07-01 DIAGNOSIS — Z79899 Other long term (current) drug therapy: Secondary | ICD-10-CM | POA: Diagnosis not present

## 2016-07-01 DIAGNOSIS — F329 Major depressive disorder, single episode, unspecified: Secondary | ICD-10-CM | POA: Insufficient documentation

## 2016-07-01 MED ORDER — TERBUTALINE SULFATE 1 MG/ML IJ SOLN
0.2500 mg | Freq: Once | INTRAMUSCULAR | Status: AC
  Administered 2016-07-01: 0.25 mg via SUBCUTANEOUS
  Filled 2016-07-01: qty 1

## 2016-07-01 MED ORDER — LACTATED RINGERS IV BOLUS (SEPSIS): 500.0000 mL | Freq: Once | INTRAVENOUS | Status: DC | PRN

## 2016-07-01 MED ORDER — LACTATED RINGERS IV SOLN
INTRAVENOUS | Status: DC
  Administered 2016-07-01: 08:00:00 via INTRAVENOUS

## 2016-07-01 NOTE — Discharge Instructions (Signed)
Introduction Patient Name: ________________________________________________ Patient Due Date: ____________________ What is a fetal movement count? A fetal movement count is the number of times that you feel your baby move during a certain amount of time. This may also be called a fetal kick count. A fetal movement count is recommended for every pregnant woman. You may be asked to start counting fetal movements as early as week 28 of your pregnancy. Pay attention to when your baby is most active. You may notice your baby's sleep and wake cycles. You may also notice things that make your baby move more. You should do a fetal movement count: When your baby is normally most active. At the same time each day. A good time to count movements is while you are resting, after having something to eat and drink. How do I count fetal movements? Find a quiet, comfortable area. Sit, or lie down on your side. Write down the date, the start time and stop time, and the number of movements that you felt between those two times. Take this information with you to your health care visits. For 2 hours, count kicks, flutters, swishes, rolls, and jabs. You should feel at least 10 movements during 2 hours. You may stop counting after you have felt 10 movements. If you do not feel 10 movements in 2 hours, have something to eat and drink. Then, keep resting and counting for 1 hour. If you feel at least 4 movements during that hour, you may stop counting. Contact a health care provider if: You feel fewer than 4 movements in 2 hours. Your baby is not moving like he or she usually does. Date: ____________ Start time: ____________ Stop time: ____________ Movements: ____________ Date: ____________ Start time: ____________ Stop time: ____________ Movements: ____________ Date: ____________ Start time: ____________ Stop time: ____________ Movements: ____________ Date: ____________ Start time: ____________ Stop time: ____________  Movements: ____________ Date: ____________ Start time: ____________ Stop time: ____________ Movements: ____________ Date: ____________ Start time: ____________ Stop time: ____________ Movements: ____________ Date: ____________ Start time: ____________ Stop time: ____________ Movements: ____________ Date: ____________ Start time: ____________ Stop time: ____________ Movements: ____________ Date: ____________ Start time: ____________ Stop time: ____________ Movements: ____________ This information is not intended to replace advice given to you by your health care provider. Make sure you discuss any questions you have with your health care provider. Document Released: 08/10/2006 Document Revised: 03/09/2016 Document Reviewed: 08/20/2015 Elsevier Interactive Patient Education  2017 [employer] Vaginal Delivery Vaginal delivery means that you will give birth by pushing your baby out of your birth canal (vagina). A team of health care providers will help you before, during, and after vaginal delivery. Birth experiences are unique for every woman and every pregnancy, and birth experiences vary depending on where you choose to give birth. What should I do to prepare for my baby's birth? Before your baby is born, it is important to talk with your health care provider about:  Your labor and delivery preferences. These may include:  Medicines that you may be given.  How you will manage your pain. This might include non-medical pain relief techniques or injectable pain relief such as epidural analgesia.  How you and your baby will be monitored during labor and delivery.  Who may be in the labor and delivery room with you.  Your feelings about surgical delivery of your baby (cesarean delivery, or C-section) if this becomes necessary.  Your feelings about receiving donated blood through an IV tube (blood transfusion) if this becomes necessary.  Whether you are able:  To take pictures or videos of  the birth.  To eat during labor and delivery.  To move around, walk, or change positions during labor and delivery.  What to expect after your baby is born, such as:  Whether delayed umbilical cord clamping and cutting is offered.  Who will care for your baby right after birth.  Medicines or tests that may be recommended for your baby.  Whether breastfeeding is supported in your hospital or birth center.  How long you will be in the hospital or birth center.  How any medical conditions you have may affect your baby or your labor and delivery experience. To prepare for your baby's birth, you should also:  Attend all of your health care visits before delivery (prenatal visits) as recommended by your health care provider. This is important.  Prepare your home for your baby's arrival. Make sure that you have:  Diapers.  Baby clothing.  Feeding equipment.  Safe sleeping arrangements for you and your baby.  Install a car seat in your vehicle. Have your car seat checked by a certified car seat installer to make sure that it is installed safely.  Think about who will help you with your new baby at home for at least the first several weeks after delivery. What can I expect when I arrive at the birth center or hospital? Once you are in labor and have been admitted into the hospital or birth center, your health care provider may:  Review your pregnancy history and any concerns you have.  Insert an IV tube into one of your veins. This is used to give you fluids and medicines.  Check your blood pressure, pulse, temperature, and heart rate (vital signs).  Check whether your bag of water (amniotic sac) has broken (ruptured).  Talk with you about your birth plan and discuss pain control options. Monitoring Your health care provider may monitor your contractions (uterine monitoring) and your baby's heart rate (fetal monitoring). You may need to be monitored:  Often, but not  continuously (intermittently).  All the time or for long periods at a time (continuously). Continuous monitoring may be needed if:  You are taking certain medicines, such as medicine to relieve pain or make your contractions stronger.  You have pregnancy or labor complications. Monitoring may be done by:  Placing a special stethoscope or a handheld monitoring device on your abdomen to check your baby's heartbeat, and feeling your abdomen for contractions. This method of monitoring does not continuously record your baby's heartbeat or your contractions.  Placing monitors on your abdomen (external monitors) to record your baby's heartbeat and the frequency and length of contractions. You may not have to wear external monitors all the time.  Placing monitors inside of your uterus (internal monitors) to record your baby's heartbeat and the frequency, length, and strength of your contractions.  Your health care provider may use internal monitors if he or she needs more information about the strength of your contractions or your baby's heart rate.  Internal monitors are put in place by passing a thin, flexible wire through your vagina and into your uterus. Depending on the type of monitor, it may remain in your uterus or on your baby's head until birth.  Your health care provider will discuss the benefits and risks of internal monitoring with you and will ask for your permission before inserting the monitors.  Telemetry. This is a type of continuous monitoring that can be done with  external or internal monitors. Instead of having to stay in bed, you are able to move around during telemetry. Ask your health care provider if telemetry is an option for you. Physical exam Your health care provider may perform a physical exam. This may include:  Checking whether your baby is positioned:  With the head toward your vagina (head-down). This is most common.  With the head toward the top of your uterus  (head-up or breech). If your baby is in a breech position, your health care provider may try to turn your baby to a head-down position so you can deliver vaginally. If it does not seem that your baby can be born vaginally, your provider may recommend surgery to deliver your baby. In rare cases, you may be able to deliver vaginally if your baby is head-up (breech delivery).  Lying sideways (transverse). Babies that are lying sideways cannot be delivered vaginally.  Checking your cervix to determine:  Whether it is thinning out (effacing).  Whether it is opening up (dilating).  How low your baby has moved into your birth canal. What are the three stages of labor and delivery?   Normal labor and delivery is divided into the following three stages: Stage 1  Stage 1 is the longest stage of labor, and it can last for hours or days. Stage 1 includes:  Early labor. This is when contractions may be irregular, or regular and mild. Generally, early labor contractions are more than 10 minutes apart.  Active labor. This is when contractions get longer, more regular, more frequent, and more intense.  The transition phase. This is when contractions happen very close together, are very intense, and may last longer than during any other part of labor.  Contractions generally feel mild, infrequent, and irregular at first. They get stronger, more frequent (about every 2-3 minutes), and more regular as you progress from early labor through active labor and transition.  Many women progress through stage 1 naturally, but you may need help to continue making progress. If this happens, your health care provider may talk with you about:  Rupturing your amniotic sac if it has not ruptured yet.  Giving you medicine to help make your contractions stronger and more frequent.  Stage 1 ends when your cervix is completely dilated to 4 inches (10 cm) and completely effaced. This happens at the end of the transition  phase. Stage 2  Once your cervix is completely effaced and dilated to 4 inches (10 cm), you may start to feel an urge to push. It is common for the body to naturally take a rest before feeling the urge to push, especially if you received an epidural or certain other pain medicines. This rest period may last for up to 1-2 hours, depending on your unique labor experience.  During stage 2, contractions are generally less painful, because pushing helps relieve contraction pain. Instead of contraction pain, you may feel stretching and burning pain, especially when the widest part of your baby's head passes through the vaginal opening (crowning).  Your health care provider will closely monitor your pushing progress and your baby's progress through the vagina during stage 2.  Your health care provider may massage the area of skin between your vaginal opening and anus (perineum) or apply warm compresses to your perineum. This helps it stretch as the baby's head starts to crown, which can help prevent perineal tearing.  In some cases, an incision may be made in your perineum (episiotomy) to allow the  baby to pass through the vaginal opening. An episiotomy helps to make the opening of the vagina larger to allow more room for the baby to fit through.  It is very important to breathe and focus so your health care provider can control the delivery of your baby's head. Your health care provider may have you decrease the intensity of your pushing, to help prevent perineal tearing.  After delivery of your baby's head, the shoulders and the rest of the body generally deliver very quickly and without difficulty.  Once your baby is delivered, the umbilical cord may be cut right away, or this may be delayed for 1-2 minutes, depending on your baby's health. This may vary among health care providers, hospitals, and birth centers.  If you and your baby are healthy enough, your baby may be placed on your chest or abdomen  to help maintain the baby's temperature and to help you bond with each other. Some mothers and babies start breastfeeding at this time. Your health care team will dry your baby and help keep your baby warm during this time.  Your baby may need immediate care if he or she:  Showed signs of distress during labor.  Has a medical condition.  Was born too early (prematurely).  Had a bowel movement before birth (meconium).  Shows signs of difficulty transitioning from being inside the uterus to being outside of the uterus. If you are planning to breastfeed, your health care team will help you begin a feeding. Stage 3  The third stage of labor starts immediately after the birth of your baby and ends after you deliver the placenta. The placenta is an organ that develops during pregnancy to provide oxygen and nutrients to your baby in the womb.  Delivering the placenta may require some pushing, and you may have mild contractions. Breastfeeding can stimulate contractions to help you deliver the placenta.  After the placenta is delivered, your uterus should tighten (contract) and become firm. This helps to stop bleeding in your uterus. To help your uterus contract and to control bleeding, your health care provider may:  Give you medicine by injection, through an IV tube, by mouth, or through your rectum (rectally).  Massage your abdomen or perform a vaginal exam to remove any blood clots that are left in your uterus.  Empty your bladder by placing a thin, flexible tube (catheter) into your bladder.  Encourage you to breastfeed your baby. After labor is over, you and your baby will be monitored closely to ensure that you are both healthy until you are ready to go home. Your health care team will teach you how to care for yourself and your baby. This information is not intended to replace advice given to you by your health care provider. Make sure you discuss any questions you have with your health  care provider. Document Released: 04/19/2008 Document Revised: 01/29/2016 Document Reviewed: 07/26/2015 Elsevier Interactive Patient Education  2017 [employer] External Cephalic Version External cephalic version is turning a baby that is presenting his or her buttocks first (breech) or is lying sideways in the uterus (transverse) to a head-first position. This makes the labor and delivery faster, safer for the mother and baby, and lessens the chance for a cesarean section. It should not be tried until the pregnancy is [redacted] weeks along or longer. Before the procedure  Do not take aspirin.  Do not eat for 4 hours before the procedure.  Tell your caregiver if you have a cold,  fever, or an infection.  Tell your caregiver if you are having contractions.  Tell your caregiver if you are leaking or had a gush of fluid from your vagina.  Tell your caregiver if you have any vaginal bleeding or abnormal discharge.  If you are being admitted the same day, arrive at the hospital at least one hour before the procedure to sign any necessary documents and to get prepared for the procedure.  Tell your caregiver if you had any problems with anesthetics in the past.  Tell your caregiver if you are taking any medications that your caregiver does not know about. This includes over-the-counter and prescription drugs, herbs, eye drops and creams. What happens during the procedure?  First, an ultrasound is done to make sure the baby is breech or transverse.  A non-stress test or biophysical profile is done on the baby before the ECV. This is done to make sure it is safe for the baby to have the ECV. It may also be done after the procedure to make sure the baby is okay.  ECV is done in the delivery/surgical room with an anesthesiologist present. There should be a setup for an emergency cesarean section with a full nursing and nursery staff available and ready.  The patient may be given a medication to relax  the uterine muscles. An epidural may be given for any discomfort. It is helpful for the success of the ECV.  An electronic fetal monitor is placed on the uterus during the procedure to make sure the baby is okay.  If the mother is Rh-negative, Rho (D) immune globulin will be given to her to prevent Rh problems for future pregnancies.  The mother is followed closely for 2 to 3 hours after the procedure to make sure no problems develop. Benefits of ECV  Easier and safer labor and delivery for the mother and baby.  Lower incidence of cesarean section.  Lower costs with a vaginal delivery. Risks of ECV  The placenta pulls away from the wall of the uterus before delivery (abruption of the placenta).  Rupture of the uterus, especially in patients with a previous cesarean section.  Fetal distress.  Early (premature) labor.  Premature rupture of the membranes.  The baby will return to the breech or transverse lie position.  Death of the fetus can happen but is very rare. ECV should be stopped if:  The fetal heart tones drop.  The mother is having a lot of pain.  You cannot turn the baby after several attempts. ECV should not be done if:  The non-stress test or biophysical profile is abnormal.  There is vaginal bleeding.  An abnormal shaped uterus is present.  There is heart disease or uncontrolled high blood pressure in the mother.  There are twins or more.  The placenta covers the opening of the cervix (placenta previa).  You had a previous cesarean section with a classical incision or major surgery of the uterus.  There is not enough amniotic fluid in the sac (oligohydramnios).  The baby is too small for the pregnancy or has not developed normally (anomaly).  Your membranes have ruptured. Follow these instructions at home:  Have someone take you home after the procedure.  Rest at home for several hours.  Have someone stay with you for a few hours after you get  home.  After ECV, continue with your prenatal visits as directed.  Continue your regular diet, rest and activities.  Do not do any strenuous activities  for a couple of days. Get help right away if:  You develop vaginal bleeding.  You have fluid coming out of your vagina (bag of water may have broken).  You develop uterine contractions.  You do not feel the baby move or there is less movement of the baby.  You develop abdominal pain.  You develop an oral temperature of 102F (38.9C) or higher. This information is not intended to replace advice given to you by your health care provider. Make sure you discuss any questions you have with your health care provider. Document Released: 01/03/2007 Document Revised: 12/23/2015 Document Reviewed: 07/25/2015 Elsevier Interactive Patient Education  2017 [employer].

## 2016-07-01 NOTE — H&P (Signed)
  Ninetta Lightsatasha Hicks is an 35 y.o. [phone] female.   Chief Complaint: breech HPI: Patient here for ECV  Past Medical History:  Diagnosis Date  . ADHD (attention deficit hyperactivity disorder)   . Anemia   . Depression   . Headache     Past Surgical History:  Procedure Laterality Date  . NO PAST SURGERIES      Family History  Problem Relation Age of Onset  . Asthma Sister   . Depression Sister   . Asthma Maternal Grandmother   . Drug abuse Other   . Alcohol abuse Neg Hx   . Arthritis Neg Hx   . Birth defects Neg Hx   . Cancer Neg Hx   . COPD Neg Hx   . Diabetes Neg Hx   . Hypertension Neg Hx   . Hyperlipidemia Neg Hx   . Hearing loss Neg Hx   . Early death Neg Hx   . Heart disease Neg Hx   . Kidney disease Neg Hx   . Mental illness Neg Hx   . Mental retardation Neg Hx   . Learning disabilities Neg Hx   . Miscarriages / Stillbirths Neg Hx   . Stroke Neg Hx   . Vision loss Neg Hx   . Varicose Veins Neg Hx    Social History:  reports that she quit smoking about 2 years ago. She has never used smokeless tobacco. She reports that she uses drugs, including Marijuana. She reports that she does not drink alcohol.   No Known Allergies  No current facility-administered medications on file prior to encounter.    Current Outpatient Prescriptions on File Prior to Encounter  Medication Sig Dispense Refill  . ibuprofen (ADVIL,MOTRIN) 600 MG tablet Take 1 tablet (600 mg total) by mouth every 6 (six) hours as needed. (Patient not taking: Reported on 02/18/2016) 36 tablet 2  . oxyCODONE-acetaminophen (PERCOCET/ROXICET) 5-325 MG per tablet Take 1 tablet by mouth every 4 (four) hours as needed (for pain scale less than 7). (Patient not taking: Reported on 02/18/2016) 30 tablet 0  . Prenatal Vit-Fe Fum-Fe Bisg-FA (NATACHEW) 28-1 MG CHEW Chew 1 tablet by mouth daily. 30 tablet 12    A comprehensive review of systems was negative.  Height 5' 7.5" (1.715 m), weight 177 lb (80.3 kg),  last menstrual period 10/12/2015, unknown if currently breastfeeding. Ht 5' 7.5" (1.715 m)   Wt 177 lb (80.3 kg)   LMP 10/12/2015 (Approximate)   BMI 27.31 kg/m  General appearance: alert, cooperative and appears stated age Head: Normocephalic, without obvious abnormality, atraumatic Neck: supple, symmetrical, trachea midline Lungs: normal effort Heart: regular rate and rhythm Abdomen: gravid, NT Extremities: Homans sign is negative, no sign of DVT Skin: Skin color, texture, turgor normal. No rashes or lesions Neurologic: Grossly normal   Lab Results  Component Value Date   WBC 16.6 (H) 08/28/2014   HGB 10.4 (L) 08/28/2014   HCT 28.9 (L) 08/28/2014   MCV 84.0 08/28/2014   PLT 173 08/28/2014         ABO, Rh: B/Positive/-- (07/10 0000)  Antibody: Negative (07/10 0000)  Rubella: !Error!  RPR: Nonreactive (07/10 0000)  HBsAg: Negative (07/10 0000)  HIV: Non-reactive (07/10 0000)  GBS: Negative (11/28 0000)     Assessment/Plan Principal Problem:   Breech presentation with antenatal problem  For ECV  Reva Boresanya S Laurajean Hosek 07/01/2016, 8:58 AM

## 2016-07-01 NOTE — Progress Notes (Signed)
Patient ID: [name], female   DOB: 04/16/1981, 35 y.o.   MRN: [id number] After informed verbal consent, Terbutaline 0.25 mg SQ given, ECV was attempted under Ultrasound guidance.  Forward roll completed. 4 attempts total and successful finally.   FHR was reactive before and after the procedure.   Pt. Tolerated the procedure well.

## 2016-07-16 ENCOUNTER — Inpatient Hospital Stay (HOSPITAL_COMMUNITY)
Admission: AD | Admit: 2016-07-16 | Discharge: 2016-07-18 | DRG: 775 | Disposition: A | Discharge status: Home / Self Care | Payer: Medicaid Other | Source: Ambulatory Visit | Attending: Obstetrics & Gynecology | Admitting: Obstetrics & Gynecology

## 2016-07-16 ENCOUNTER — Inpatient Hospital Stay (HOSPITAL_COMMUNITY): Payer: Medicaid Other | Admitting: Anesthesiology

## 2016-07-16 ENCOUNTER — Encounter (HOSPITAL_COMMUNITY): Payer: Self-pay | Admitting: *Deleted

## 2016-07-16 DIAGNOSIS — Z87891 Personal history of nicotine dependence: Secondary | ICD-10-CM

## 2016-07-16 DIAGNOSIS — O321XX0 Maternal care for breech presentation, not applicable or unspecified: Secondary | ICD-10-CM | POA: Diagnosis present

## 2016-07-16 DIAGNOSIS — O99344 Other mental disorders complicating childbirth: Secondary | ICD-10-CM | POA: Diagnosis present

## 2016-07-16 DIAGNOSIS — F329 Major depressive disorder, single episode, unspecified: Secondary | ICD-10-CM | POA: Diagnosis present

## 2016-07-16 DIAGNOSIS — O09523 Supervision of elderly multigravida, third trimester: Secondary | ICD-10-CM

## 2016-07-16 DIAGNOSIS — Z3493 Encounter for supervision of normal pregnancy, unspecified, third trimester: Secondary | ICD-10-CM | POA: Diagnosis present

## 2016-07-16 DIAGNOSIS — Z3A39 39 weeks gestation of pregnancy: Secondary | ICD-10-CM

## 2016-07-16 LAB — CBC
HEMATOCRIT: 31.1 % — AB (ref 36.0–46.0)
Hemoglobin: 11.2 g/dL — ABNORMAL LOW (ref 12.0–15.0)
MCH: 28.8 pg (ref 26.0–34.0)
MCHC: 36 g/dL (ref 30.0–36.0)
MCV: 79.9 fL (ref 78.0–100.0)
Platelets: 202 10*3/uL (ref 150–400)
RBC: 3.89 MIL/uL (ref 3.87–5.11)
RDW: 14.3 % (ref 11.5–15.5)
WBC: 8.5 10*3/uL (ref 4.0–10.5)

## 2016-07-16 LAB — TYPE AND SCREEN
ABO/RH(D): B POS
ANTIBODY SCREEN: NEGATIVE

## 2016-07-16 LAB — RPR: RPR Ser Ql: NONREACTIVE

## 2016-07-16 MED ORDER — SIMETHICONE 80 MG PO CHEW: 80.0000 mg | CHEWABLE_TABLET | ORAL | Status: DC | PRN

## 2016-07-16 MED ORDER — PHENYLEPHRINE 40 MCG/ML (10ML) SYRINGE FOR IV PUSH (FOR BLOOD PRESSURE SUPPORT)
80.0000 ug | PREFILLED_SYRINGE | INTRAVENOUS | Status: DC | PRN
  Administered 2016-07-16: 80 ug via INTRAVENOUS

## 2016-07-16 MED ORDER — ACETAMINOPHEN 325 MG PO TABS: 650.0000 mg | ORAL_TABLET | ORAL | Status: DC | PRN

## 2016-07-16 MED ORDER — SODIUM CHLORIDE 0.9 % IV SOLN: 250.0000 mL | INTRAVENOUS | Status: DC | PRN

## 2016-07-16 MED ORDER — TERBUTALINE SULFATE 1 MG/ML IJ SOLN: 0.2500 mg | Freq: Once | INTRAMUSCULAR | Status: DC | PRN

## 2016-07-16 MED ORDER — SODIUM CHLORIDE 0.9% FLUSH: 3.0000 mL | Freq: Two times a day (BID) | INTRAVENOUS | Status: DC

## 2016-07-16 MED ORDER — PHENYLEPHRINE 40 MCG/ML (10ML) SYRINGE FOR IV PUSH (FOR BLOOD PRESSURE SUPPORT): 80.0000 ug | PREFILLED_SYRINGE | INTRAVENOUS | Status: DC | PRN

## 2016-07-16 MED ORDER — ONDANSETRON HCL 4 MG PO TABS: 4.0000 mg | ORAL_TABLET | ORAL | Status: DC | PRN

## 2016-07-16 MED ORDER — FENTANYL 2.5 MCG/ML BUPIVACAINE 1/10 % EPIDURAL INFUSION (WH - ANES)
14.0000 mL/h | INTRAMUSCULAR | Status: DC | PRN
  Administered 2016-07-16 (×3): 14 mL/h via EPIDURAL
  Filled 2016-07-16: qty 100

## 2016-07-16 MED ORDER — FENTANYL 2.5 MCG/ML BUPIVACAINE 1/10 % EPIDURAL INFUSION (WH - ANES)
INTRAMUSCULAR | Status: AC
  Filled 2016-07-16: qty 100

## 2016-07-16 MED ORDER — SODIUM CHLORIDE 0.9% FLUSH: 3.0000 mL | INTRAVENOUS | Status: DC | PRN

## 2016-07-16 MED ORDER — OXYTOCIN 40 UNITS IN LACTATED RINGERS INFUSION - SIMPLE MED
1.0000 m[IU]/min | INTRAVENOUS | Status: DC
  Administered 2016-07-16: 2 m[IU]/min via INTRAVENOUS
  Filled 2016-07-16: qty 1000

## 2016-07-16 MED ORDER — LACTATED RINGERS IV SOLN
500.0000 mL | Freq: Once | INTRAVENOUS | Status: AC
  Administered 2016-07-16: 500 mL via INTRAVENOUS

## 2016-07-16 MED ORDER — PRENATAL MULTIVITAMIN CH
1.0000 | ORAL_TABLET | Freq: Every day | ORAL | Status: DC
  Administered 2016-07-17: 1 via ORAL
  Filled 2016-07-16: qty 1

## 2016-07-16 MED ORDER — LIDOCAINE HCL (PF) 1 % IJ SOLN
30.0000 mL | INTRAMUSCULAR | Status: DC | PRN
  Filled 2016-07-16: qty 30

## 2016-07-16 MED ORDER — OXYTOCIN BOLUS FROM INFUSION
500.0000 mL | Freq: Once | INTRAVENOUS | Status: AC
  Administered 2016-07-16: 500 mL via INTRAVENOUS

## 2016-07-16 MED ORDER — LACTATED RINGERS IV SOLN
INTRAVENOUS | Status: DC
  Administered 2016-07-16: 14:00:00 via INTRAUTERINE

## 2016-07-16 MED ORDER — ACETAMINOPHEN 325 MG PO TABS
650.0000 mg | ORAL_TABLET | ORAL | Status: DC | PRN
  Administered 2016-07-17: 650 mg via ORAL
  Filled 2016-07-16: qty 2

## 2016-07-16 MED ORDER — DIPHENHYDRAMINE HCL 50 MG/ML IJ SOLN: 12.5000 mg | INTRAMUSCULAR | Status: DC | PRN

## 2016-07-16 MED ORDER — OXYCODONE-ACETAMINOPHEN 5-325 MG PO TABS: 2.0000 | ORAL_TABLET | ORAL | Status: DC | PRN

## 2016-07-16 MED ORDER — LACTATED RINGERS IV SOLN
INTRAVENOUS | Status: DC
  Administered 2016-07-16 (×3): via INTRAVENOUS

## 2016-07-16 MED ORDER — LIDOCAINE HCL (PF) 1 % IJ SOLN
INTRAMUSCULAR | Status: DC | PRN
  Administered 2016-07-16: 4 mL
  Administered 2016-07-16: 6 mL via EPIDURAL

## 2016-07-16 MED ORDER — OXYTOCIN 40 UNITS IN LACTATED RINGERS INFUSION - SIMPLE MED: 2.5000 [IU]/h | INTRAVENOUS | Status: DC

## 2016-07-16 MED ORDER — TETANUS-DIPHTH-ACELL PERTUSSIS 5-2.5-18.5 LF-MCG/0.5 IM SUSP: 0.5000 mL | Freq: Once | INTRAMUSCULAR | Status: DC

## 2016-07-16 MED ORDER — LACTATED RINGERS IV SOLN
500.0000 mL | INTRAVENOUS | Status: DC | PRN
Start: 2016-07-16 — End: 2016-07-16
  Administered 2016-07-16 (×2): 500 mL via INTRAVENOUS

## 2016-07-16 MED ORDER — FLEET ENEMA 7-19 GM/118ML RE ENEM: 1.0000 | ENEMA | RECTAL | Status: DC | PRN

## 2016-07-16 MED ORDER — IBUPROFEN 600 MG PO TABS
600.0000 mg | ORAL_TABLET | Freq: Four times a day (QID) | ORAL | Status: DC
Start: 2016-07-16 — End: 2016-07-18
  Administered 2016-07-16 – 2016-07-18 (×5): 600 mg via ORAL
  Filled 2016-07-16 (×6): qty 1

## 2016-07-16 MED ORDER — EPHEDRINE 5 MG/ML INJ: 10.0000 mg | INTRAVENOUS | Status: DC | PRN

## 2016-07-16 MED ORDER — SENNOSIDES-DOCUSATE SODIUM 8.6-50 MG PO TABS
2.0000 | ORAL_TABLET | ORAL | Status: DC
  Administered 2016-07-16: 2 via ORAL
  Filled 2016-07-16 (×2): qty 2

## 2016-07-16 MED ORDER — PHENYLEPHRINE 40 MCG/ML (10ML) SYRINGE FOR IV PUSH (FOR BLOOD PRESSURE SUPPORT)
PREFILLED_SYRINGE | INTRAVENOUS | Status: AC
  Filled 2016-07-16: qty 20

## 2016-07-16 MED ORDER — ONDANSETRON HCL 4 MG/2ML IJ SOLN: 4.0000 mg | Freq: Four times a day (QID) | INTRAMUSCULAR | Status: DC | PRN

## 2016-07-16 MED ORDER — OXYCODONE-ACETAMINOPHEN 5-325 MG PO TABS: 1.0000 | ORAL_TABLET | ORAL | Status: DC | PRN

## 2016-07-16 MED ORDER — WITCH HAZEL-GLYCERIN EX PADS: 1.0000 "application " | MEDICATED_PAD | CUTANEOUS | Status: DC | PRN

## 2016-07-16 MED ORDER — BENZOCAINE-MENTHOL 20-0.5 % EX AERO: 1.0000 "application " | INHALATION_SPRAY | CUTANEOUS | Status: DC | PRN

## 2016-07-16 MED ORDER — ZOLPIDEM TARTRATE 5 MG PO TABS: 5.0000 mg | ORAL_TABLET | Freq: Every evening | ORAL | Status: DC | PRN

## 2016-07-16 MED ORDER — DIPHENHYDRAMINE HCL 25 MG PO CAPS: 25.0000 mg | ORAL_CAPSULE | Freq: Four times a day (QID) | ORAL | Status: DC | PRN

## 2016-07-16 MED ORDER — ONDANSETRON HCL 4 MG/2ML IJ SOLN: 4.0000 mg | INTRAMUSCULAR | Status: DC | PRN

## 2016-07-16 MED ORDER — SOD CITRATE-CITRIC ACID 500-334 MG/5ML PO SOLN: 30.0000 mL | ORAL | Status: DC | PRN

## 2016-07-16 MED ORDER — DIBUCAINE 1 % RE OINT: 1.0000 "application " | TOPICAL_OINTMENT | RECTAL | Status: DC | PRN

## 2016-07-16 MED ORDER — OXYTOCIN 40 UNITS IN LACTATED RINGERS INFUSION - SIMPLE MED: 2.5000 [IU]/h | INTRAVENOUS | Status: DC | PRN

## 2016-07-16 MED ORDER — COCONUT OIL OIL: 1.0000 "application " | TOPICAL_OIL | Status: DC | PRN

## 2016-07-16 NOTE — H&P (Signed)
LABOR AND DELIVERY ADMISSION HISTORY AND PHYSICAL NOTE  [name] is a 35 y.o. female [phone] with IUP at 9577w5d by 13wk US presenting for SOL this morning. She reports regular contractions and is much more comfortable after her epidural.    She reports positive fetal movement. She denies leakage of fluid or vaginal bleeding.  Prenatal History/Complications:  Past Medical History: Past Medical History:  Diagnosis Date  . ADHD (attention deficit hyperactivity disorder)   . Anemia   . Depression   . Headache     Past Surgical History: Past Surgical History:  Procedure Laterality Date  . NO PAST SURGERIES      Obstetrical History: OB History    Gravida Para Term Preterm AB Living   6 1 1   4 1    SAB TAB Ectopic Multiple Live Births     4   0 1      Social History: Social History   Social History  . Marital status: Single    Spouse name: N/A  . Number of children: N/A  . Years of education: N/A   Social History Main Topics  . Smoking status: Former Smoker    Quit date: 12/28/2013  . Smokeless tobacco: Never Used  . Alcohol use No  . Drug use:     Types: Marijuana     Comment: 12/28/2013  . Sexual activity: Yes    Birth control/ protection: None   Other Topics Concern  . None   Social History Narrative  . None    Family History: Family History  Problem Relation Age of Onset  . Asthma Sister   . Depression Sister   . Asthma Maternal Grandmother   . Drug abuse Other   . Alcohol abuse Neg Hx   . Arthritis Neg Hx   . Birth defects Neg Hx   . Cancer Neg Hx   . COPD Neg Hx   . Diabetes Neg Hx   . Hypertension Neg Hx   . Hyperlipidemia Neg Hx   . Hearing loss Neg Hx   . Early death Neg Hx   . Heart disease Neg Hx   . Kidney disease Neg Hx   . Mental illness Neg Hx   . Mental retardation Neg Hx   . Learning disabilities Neg Hx   . Miscarriages / Stillbirths Neg Hx   . Stroke Neg Hx   . Vision loss Neg Hx   . Varicose Veins Neg Hx      Allergies: No Known Allergies  Prescriptions Prior to Admission  Medication Sig Dispense Refill Last Dose  . Prenatal Vit-Fe Fum-Fe Bisg-FA (NATACHEW) 28-1 MG CHEW Chew 1 tablet by mouth daily. 30 tablet 12 Past Week at Unknown time     Review of Systems   All systems reviewed and negative except as stated in HPI  Blood pressure 102/73, pulse 82, temperature 97.6 F (36.4 C), temperature source Oral, height 5\' 7"  (1.702 m), weight 179 lb (81.2 kg), last menstrual period 10/12/2015, SpO2 100 %, unknown if currently breastfeeding. General appearance: alert, cooperative and appears stated age Lungs: clear to auscultation bilaterally Heart: regular rate and rhythm Abdomen: soft, non-tender; bowel sounds normal Extremities: No calf swelling or tenderness Presentation: cephalic by MD Ultrasound  Fetal monitoring: category 1 Uterine activity: contraction q5 mintues Dilation: 4 Effacement (%): 70 Station: -3 Exam by:: ansah-mensah, rnc    Prenatal labs: ABO, Rh: --/--/B POS (12/23 0445) Antibody: NEG (12/23 0445) Rubella:  RPR: Nonreactive (07/10 0000)  HBsAg:  Negative (07/10 0000)  HIV: Non-reactive (07/10 0000)  GBS: Negative (11/28 0000)  1 hr Glucola: wnl Genetic screening:  AFP mildly elevated Anatomy US: normal  Prenatal Transfer Tool  Maternal Diabetes: No Genetic Screening: Abnormal:  Results: Elevated AFP Maternal Ultrasounds/Referrals: Normal Fetal Ultrasounds or other Referrals:  None Maternal Substance Abuse:  No Significant Maternal Medications:  None Significant Maternal Lab Results: Lab values include: Group B Strep negative  Results for orders placed or performed during the hospital encounter of 07/16/16 (from the past 24 hour(s))  CBC   Collection Time: 07/16/16  4:45 AM  Result Value Ref Range   WBC 8.5 4.0 - 10.5 K/uL   RBC 3.89 3.87 - 5.11 MIL/uL   Hemoglobin 11.2 (L) 12.0 - 15.0 g/dL   HCT 40.931.1 (L) 81.136.0 - 91.446.0 %   MCV 79.9 78.0 - 100.0 fL    MCH 28.8 26.0 - 34.0 pg   MCHC 36.0 30.0 - 36.0 g/dL   RDW 78.214.3 95.611.5 - 21.315.5 %   Platelets 202 150 - 400 K/uL  Type and screen [facility]   Collection Time: 07/16/16  4:45 AM  Result Value Ref Range   ABO/RH(D) B POS    Antibody Screen NEG    Sample Expiration 07/19/2016     Patient Active Problem List   Diagnosis Date Noted  . Normal labor 07/16/2016  . Breech presentation with antenatal problem 07/01/2016  . Breech malpresentation successfully converted to cephalic presentation 07/01/2016  . Advanced maternal age in multigravida 02/12/2016  . 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 ADD (attention deficit disorder) 08/26/2014  . Depression 08/26/2014  . H/O abortion x 4 08/26/2014    Assessment: [name] is a 35 y.o. [phone] at 4028w5d here for SOL  #Labor:expectant management, could consider starting pitocin if her cervix cahnges #Pain: Epidural in place #FWB:  category 1 #ID:  GBS neg #MOF: breast #MOC:depo #Circ:  Yes as an outpatient  Ernestina Pennaicholas Lyla Jasek 07/16/2016, 7:35 AM

## 2016-07-16 NOTE — Progress Notes (Signed)
Patient ID: [name], female   DOB: 11/17/1980, 35 y.o.   MRN: [id number]  S: Patient seen & examined for progress of labor. Patient comfortable with epidural. Discussed with patient regarding IUPC placement and FSE placement. Patient agreed.    O:  Vitals:   07/16/16 1437 07/16/16 1509 07/16/16 1537 07/16/16 1606  BP: 114/81 (!) 106/54 (!) 90/56 102/65  Pulse: 98 85 (!) 104 86  Resp: 18 17  20   Temp:    98.9 F (37.2 C)  TempSrc:    Oral  SpO2:      Weight:      Height:        Dilation: 9 Effacement (%): 80, 90 Cervical Position: Middle Station: -2, -1 Presentation: Vertex Exam by:: dr Omer [place]um[place]   FHT: 120 bpm, min to mod var, no accels, frequent variable decels with return to baseline TOCO: q2-803min  IUPC placed and FSE placed. Amnioinfusion to be started.  A/P: IUPC and FSE placed Amnioinfusion Stop pitocin, O2 Industry until improved FHT Continue expectant management Anticipate SVD

## 2016-07-16 NOTE — Anesthesia Preprocedure Evaluation (Signed)

## 2016-07-16 NOTE — Progress Notes (Signed)
Ninetta Lightsatasha Hicks is a 35 y.o. [phone] at 1541w5d admitted for active labor  Subjective: Comfortable with epidural  Objective: BP 138/61   Pulse 91   Temp 98.2 F (36.8 C) (Oral)   Resp 18   Ht 5\' 7"  (1.702 m)   Wt 179 lb (81.2 kg)   LMP 10/12/2015 (Approximate)   SpO2 100%   BMI 28.04 kg/m  No intake/output data recorded. No intake/output data recorded.  FHT:  FHR: 120 bpm, variability: moderate,  accelerations:  Present,  decelerations:  Absent UC:   regular, every 4-6 minutes SVE:   Dilation: 6 Effacement (%): 70 Station: -2 Exam by:: Dr. Macon LargeAnyanwu  Labs: Lab Results  Component Value Date   WBC 8.5 07/16/2016   HGB 11.2 (L) 07/16/2016   HCT 31.1 (L) 07/16/2016   MCV 79.9 07/16/2016   PLT 202 07/16/2016    Assessment / Plan: Augmentation of labor, progressing well  Labor: Progressing normally. Will continue to monitor. Fetal Wellbeing:  Category I Pain Control:  Epidural I/D:  GBS neg Anticipated MOD:  NSVD  Jaynie CollinsUgonna Erielle Gawronski, MD 07/16/2016, 12:23 PM

## 2016-07-16 NOTE — Anesthesia Pain Management Evaluation Note (Signed)
  CRNA Pain Management Visit Note  Patient: [name], 35 y.o., female  "Hello [facility] am a [facility]. We have an anesthesia team available at all times to provide care throughout the hospital, including epidural management and anesthesia for C-section. I don't know your plan for the delivery whether it a natural birth, water birth, IV sedation, nitrous supplementation, doula or epidural, but we want to meet your pain goals."   1.Was your pain managed to your expectations on prior hospitalizations?   Yes   2.What is your expectation for pain management during this hospitalization?     Epidural  3.How can we help you reach that goal? Epidural in place at time of visit   Record the patient's initial score and the patient's pain goal.   Pain: Patient sleeping - unable to assess  Pain Goal: Patient sleeping - unable to assess [facility] wants you to be able to say your pain was always managed very well.  Rica RecordsICKELTON,[name] 07/16/2016

## 2016-07-16 NOTE — Progress Notes (Signed)
Pt on stretcher and L lateral position for transfer

## 2016-07-16 NOTE — Progress Notes (Signed)
To BS via stretcher 

## 2016-07-16 NOTE — Anesthesia Procedure Notes (Signed)
Epidural Patient location during procedure: OB Start time: 07/16/2016 5:40 AM End time: 07/16/2016 5:41 AM  Preanesthetic Checklist Completed: patient identified, site marked, surgical consent, pre-op evaluation, timeout performed, IV checked, risks and benefits discussed and monitors and equipment checked  Epidural Patient position: sitting Prep: site prepped and draped and DuraPrep Patient monitoring: continuous pulse ox and blood pressure Approach: midline Injection technique: LOR air  Needle:  Needle type: Tuohy  Needle gauge: 17 G Needle length: 9 cm and 9 Needle insertion depth: 5 cm cm Catheter type: closed end flexible Catheter size: 19 Gauge Catheter at skin depth: 10 cm Test dose: negative  Assessment Events: blood not aspirated, injection not painful, no injection resistance, negative IV test and no paresthesia  Additional Notes Dosing of Epidural:  1st dose, through catheter ............................................Marland Kitchen.  Xylocaine 40 mg  2nd dose, through catheter, after waiting 3 minutes........Marland Kitchen.Xylocaine 60 mg    As each dose occurred, patient was free of IV sx; and patient exhibited no evidence of SA injection.  Patient is more comfortable after epidural dosed. Please see RN's note for documentation of vital signs,and FHR which are stable.  Patient reminded not to try to ambulate with numb legs, and that an RN must be present when she attempts to get up.

## 2016-07-17 LAB — CBC
HCT: 26.4 % — ABNORMAL LOW (ref 36.0–46.0)
HEMOGLOBIN: 9.5 g/dL — AB (ref 12.0–15.0)
MCH: 28.7 pg (ref 26.0–34.0)
MCHC: 36 g/dL (ref 30.0–36.0)
MCV: 79.8 fL (ref 78.0–100.0)
Platelets: 166 10*3/uL (ref 150–400)
RBC: 3.31 MIL/uL — ABNORMAL LOW (ref 3.87–5.11)
RDW: 14.3 % (ref 11.5–15.5)
WBC: 16.6 10*3/uL — ABNORMAL HIGH (ref 4.0–10.5)

## 2016-07-17 NOTE — Progress Notes (Signed)
.   Post Partum Day 1  Subjective: no complaints, up ad lib, voiding and tolerating PO  Objective: Blood pressure 138/75, pulse 64, temperature 98.3 F (36.8 C), temperature source Oral, resp. rate 18, height 5\' 7"  (1.702 m), weight 81.2 kg (179 lb), last menstrual period 10/12/2015, SpO2 98 %, unknown if currently breastfeeding.  Physical Exam:  General: alert, cooperative and no distress Lochia: appropriate Uterine Fundus: firm DVT Evaluation: No evidence of DVT seen on physical exam. Negative Homan's sign. No cords or calf tenderness. No significant calf/ankle edema.   Recent Labs  07/16/16 0445 07/17/16 0559  HGB 11.2* 9.5*  HCT 31.1* 26.4*    Assessment/Plan: Plan for discharge tomorrow   LOS: 1 day   [name], CNM 07/17/2016, 8:49 AM

## 2016-07-17 NOTE — Lactation Note (Signed)
This note was copied from a baby's chart. Lactation Consultation Note  Patient Name: Boy Ninetta Lightsatasha Hicks AVWUJ'WToday's Date: 07/17/2016 Reason for consult: Initial assessment  Baby 18 hours old. Mom reports that she nursed her first child, but felt like she did not "make enough milk." Mom asking how she will know that the baby is getting enough at the breast and whether a BF baby should be burped. All questions answered and offered to assist with a latch. Mom reports that she is going to take a shower, and then the baby will get his bath. Afterwards, mom states that she will call for this LC to assist with a latch. [facility], aware of OP/BFSG and LC phone line assistance after D/C.    Maternal Data    Feeding Feeding Type: Breast Fed Length of feed: 10 min  LATCH Score/Interventions                      Lactation Tools Discussed/Used     Consult Status Consult Status: Follow-up Date: 07/18/16 Follow-up type: In-patient    Sherlyn HayJennifer D Jemmie Ledgerwood 07/17/2016, 11:19 AM

## 2016-07-17 NOTE — Anesthesia Postprocedure Evaluation (Signed)
Anesthesia Post Note  Patient: [name]  Procedure(s) Performed: * No procedures listed *  Patient location during evaluation: Mother Baby Anesthesia Type: Epidural Level of consciousness: awake and alert Pain management: satisfactory to patient Vital Signs Assessment: post-procedure vital signs reviewed and stable Respiratory status: respiratory function stable Cardiovascular status: stable Postop Assessment: no headache, no backache, epidural receding, patient able to bend at knees, no signs of nausea or vomiting and adequate PO intake Anesthetic complications: no        Last Vitals:  Vitals:   07/17/16 0002 07/17/16 0826  BP: (!) 98/54 138/75  Pulse: 93 64  Resp: 18 18  Temp: 37 C 36.8 C    Last Pain:  Vitals:   07/17/16 0826  TempSrc: Oral  PainSc: 0-No pain   Pain Goal: Patients Stated Pain Goal: 2 (07/17/16 0610)               Karleen DolphinFUSSELL,Hektor Huston

## 2016-07-17 NOTE — Clinical Social Work Maternal (Signed)
  CLINICAL SOCIAL WORK MATERNAL/CHILD NOTE  Patient Details  Name: [name] MRN: [id number] Date of Birth: 1980-08-26  Date:  07/17/2016  Clinical Social Worker Initiating Note:  Ferdinand Lango Kateria Cutrona, MSW, LCSW-A  Date/ Time Initiated:  07/17/16/0822     Child's Name:  Unkown to this Database administrator Guardian:  Other (Comment) (Not established by court system; MOB and FOB parent collectively )   Need for Interpreter:  None   Date of Referral:  07/16/16     Reason for Referral:  Other (Comment) (MOB hx of depression )   Referral Source:  [employer]   Address:  [address], [place] [address]  Phone number:      Household Members:  Self, Significant Other   Natural Supports (not living in the home):  Extended Family, Immediate Family, Parent   Professional Supports: None   Employment: Full-time   Type of Work:     Education:  9 to 11 years   Museum/gallery curator Resources:  Medicaid   Other Resources:      Cultural/Religious Considerations Which May Impact Care: None reported.   Strengths:  Compliance with medical plan , Ability to meet basic needs    Risk Factors/Current Problems:  None   Cognitive State:  Alert    Mood/Affect:  Calm , Comfortable , Interested    CSW Assessment: CSW met with MOB at bedside to complete assessment. At this time MOB was in bed breastfeeding baby. This Probation officer explained role and reasoning for visit being due to her hx of depression. MOB noted she does have a hx of depression as there is a long family history of it. MOB notes she has not had symptoms in a long time and she has been coping with self-taught coping skills. This Probation officer reviewed PPD with MOB and preventive measures of it. MOB verbalized understanding. At this time, no other needs addressed or requested. Case closed to this CSW.   CSW Plan/Description:  No Further Intervention Required/No Barriers to Discharge    Oda Cogan, MSW, [facility]  Office: [phone]

## 2016-07-18 MED ORDER — SENNOSIDES-DOCUSATE SODIUM 8.6-50 MG PO TABS: 2.0000 | ORAL_TABLET | ORAL | 2 refills | Status: DC

## 2016-07-18 MED ORDER — IBUPROFEN 600 MG PO TABS: 600.0000 mg | ORAL_TABLET | Freq: Four times a day (QID) | ORAL | 0 refills | Status: DC

## 2016-07-18 NOTE — Discharge Instructions (Signed)

## 2016-07-18 NOTE — Lactation Note (Signed)
This note was copied from a baby's chart. Lactation Consultation Note: Mother states that breastfeeding is going well.  She states that infant feed most of the night. Mother is feeling some breast changes. Advised mother that cluster feeding is normal and will occur again for several more night. Mother was given a harmony hand pump with instructions / Advised mother to continue to keep account of output. Mother is aware of available lactation services and community support.   Patient Name: [name]'UToday's Date: 07/18/2016 Reason for consult: Follow-up assessment   Maternal Data    Feeding Feeding Type: Breast Fed Length of feed: 10 min  LATCH Score/Interventions Latch: Grasps breast easily, tongue down, lips flanged, rhythmical sucking.  Audible Swallowing: Spontaneous and intermittent Intervention(s): Skin to skin  Type of Nipple: Everted at rest and after stimulation  Comfort (Breast/Nipple): Filling, red/small blisters or bruises, mild/mod discomfort  Problem noted: Filling  Hold (Positioning): No assistance needed to correctly position infant at breast. Intervention(s): Support Pillows  LATCH Score: 9  Lactation Tools Discussed/Used     Consult Status      Michel BickersKendrick, Takeela Peil McCoy 07/18/2016, 8:59 AM

## 2016-07-18 NOTE — Discharge Summary (Signed)
OB Discharge Summary     Patient Name: [name] DOB: 06/09/1981 MRN: [id number]  Date of admission: 07/16/2016 Delivering MD: Jen MowMUMAW, [facility]   Date of discharge: 07/18/2016  Admitting diagnosis: 39wks, contractions less than 5mins apart Intrauterine pregnancy: 3957w5d     Secondary diagnosis:  Active Problems:   Normal labor  Additional problems: none     Discharge diagnosis: Term Pregnancy Delivered                                                                                                Post partum procedures:none  Augmentation: AROM and Pitocin  Complications: None  Hospital course:  Onset of Labor With Vaginal Delivery     [age] y.o. yo [id number] at 4057w5d was admitted in Latent Labor on 07/16/2016. Patient had an uncomplicated labor course as follows:  Membrane Rupture Time/Date: 12:20 PM ,07/16/2016   Intrapartum Procedures: Episiotomy: None [1]                                         Lacerations:  None [1]  Patient had a delivery of a Viable infant. 07/16/2016  Information for the patient's newborn:  Gemma PayorHicks, Boy Porshe [[id number]][[id number]]  Delivery Method: Vaginal, Spontaneous Delivery (Filed from Delivery Summary)    Pateint had an uncomplicated postpartum course.  She is ambulating, tolerating a regular diet, passing flatus, and urinating well. Patient is discharged home in stable condition on 07/18/16.    Physical exam Vitals:   07/17/16 0002 07/17/16 0826 07/17/16 1814 07/18/16 0523  BP: (!) 98/54 138/75 108/79 114/71  Pulse: 93 64 82 79  Resp: 18 18 18 18   Temp: 98.6 F (37 C) 98.3 F (36.8 C) 98 F (36.7 C) 98.5 F (36.9 C)  TempSrc: Oral Oral Oral Oral  SpO2: 98%     Weight:      Height:       General: alert, cooperative and no distress Lochia: appropriate Uterine Fundus: firm Incision: N/A DVT Evaluation: No evidence of DVT seen on physical exam. Labs: Lab Results  Component Value Date   WBC 16.6 (H) 07/17/2016   HGB 9.5 (L)  07/17/2016   HCT 26.4 (L) 07/17/2016   MCV 79.8 07/17/2016   PLT 166 07/17/2016   No flowsheet data found.  Discharge instruction: per After Visit Summary and "Baby and Me Booklet".  After visit meds:  Allergies as of 07/18/2016   No Known Allergies     Medication List    TAKE these medications   ibuprofen 600 MG tablet Commonly known as:  ADVIL,MOTRIN Take 1 tablet (600 mg total) by mouth every 6 (six) hours.   NATACHEW 28-1 MG Chew Chew 1 tablet by mouth daily.   PAIN RELIEVER 325 MG tablet Generic drug:  acetaminophen Take 650 mg by mouth every 6 (six) hours as needed.   senna-docusate 8.6-50 MG tablet Commonly known as:  Senokot-S Take 2 tablets by mouth daily. Start taking on:  07/19/2016       Diet:  routine diet  Activity: Advance as tolerated. Pelvic rest for 6 weeks.   Outpatient follow up:6 weeks Follow up Appt:No future appointments. Follow up Visit:No Follow-up on file.  Postpartum contraception: Depo Provera  Newborn Data: Live born female  Birth Weight: 6 lb 13.9 oz (3115 g) APGAR: 8, 9  Baby Feeding: Breast Disposition:home with mother   07/18/2016 Clearance CootsAndrew Federica Allport, MD

## 2016-07-18 NOTE — Progress Notes (Signed)
UR chart review completed.  

## 2018-04-14 ENCOUNTER — Encounter (HOSPITAL_COMMUNITY): Payer: Self-pay | Admitting: Emergency Medicine

## 2018-04-14 ENCOUNTER — Emergency Department (HOSPITAL_COMMUNITY): Payer: Self-pay

## 2018-04-14 ENCOUNTER — Emergency Department (HOSPITAL_COMMUNITY): Admission: EM | Admit: 2018-04-14 | Payer: Self-pay | Source: Home / Self Care

## 2018-04-14 ENCOUNTER — Emergency Department (HOSPITAL_COMMUNITY)
Admission: EM | Admit: 2018-04-14 | Discharge: 2018-04-14 | Disposition: A | Discharge status: Home / Self Care | Payer: Self-pay | Attending: Emergency Medicine | Admitting: Emergency Medicine

## 2018-04-14 ENCOUNTER — Other Ambulatory Visit: Payer: Self-pay

## 2018-04-14 DIAGNOSIS — Z87891 Personal history of nicotine dependence: Secondary | ICD-10-CM | POA: Insufficient documentation

## 2018-04-14 DIAGNOSIS — Z79899 Other long term (current) drug therapy: Secondary | ICD-10-CM | POA: Insufficient documentation

## 2018-04-14 DIAGNOSIS — Z23 Encounter for immunization: Secondary | ICD-10-CM | POA: Insufficient documentation

## 2018-04-14 DIAGNOSIS — M722 Plantar fascial fibromatosis: Secondary | ICD-10-CM

## 2018-04-14 MED ORDER — TETANUS-DIPHTH-ACELL PERTUSSIS 5-2.5-18.5 LF-MCG/0.5 IM SUSP
0.5000 mL | Freq: Once | INTRAMUSCULAR | Status: AC
  Administered 2018-04-14: 0.5 mL via INTRAMUSCULAR
  Filled 2018-04-14: qty 0.5

## 2018-04-14 MED ORDER — NAPROXEN 500 MG PO TABS: 500.0000 mg | ORAL_TABLET | Freq: Two times a day (BID) | ORAL | 0 refills | Status: DC

## 2018-04-14 NOTE — ED Triage Notes (Signed)
Pt presents with pain in both feet. 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 Patient stating pain is in her arch and goes into her heels.
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
## 2018-04-14 NOTE — Discharge Instructions (Signed)
Take Naprosyn twice daily as needed for pain and inflammation.  Do not take this medication if you are pregnant.  Make sure to take with food. Use a frozen water bottle or juice can and massage your heels 3 times daily as tolerated.  Stretch 3 times daily as tolerated as well.  Please follow-up with the podiatrist if your symptoms are not improving.  Please return the emergency department if you develop any new or worsening symptoms.

## 2019-06-27 IMAGING — CR DG FOOT COMPLETE 3+V*L*
3 series · 3 of 3 positions shown · non-contrast
Comparison: None.

CLINICAL DATA: Plantar calcaneal foot pain since December 2017. The
patient stepped on glass in November 2017.

EXAM:
LEFT FOOT - COMPLETE 3+ VIEW

[x foot ap left]
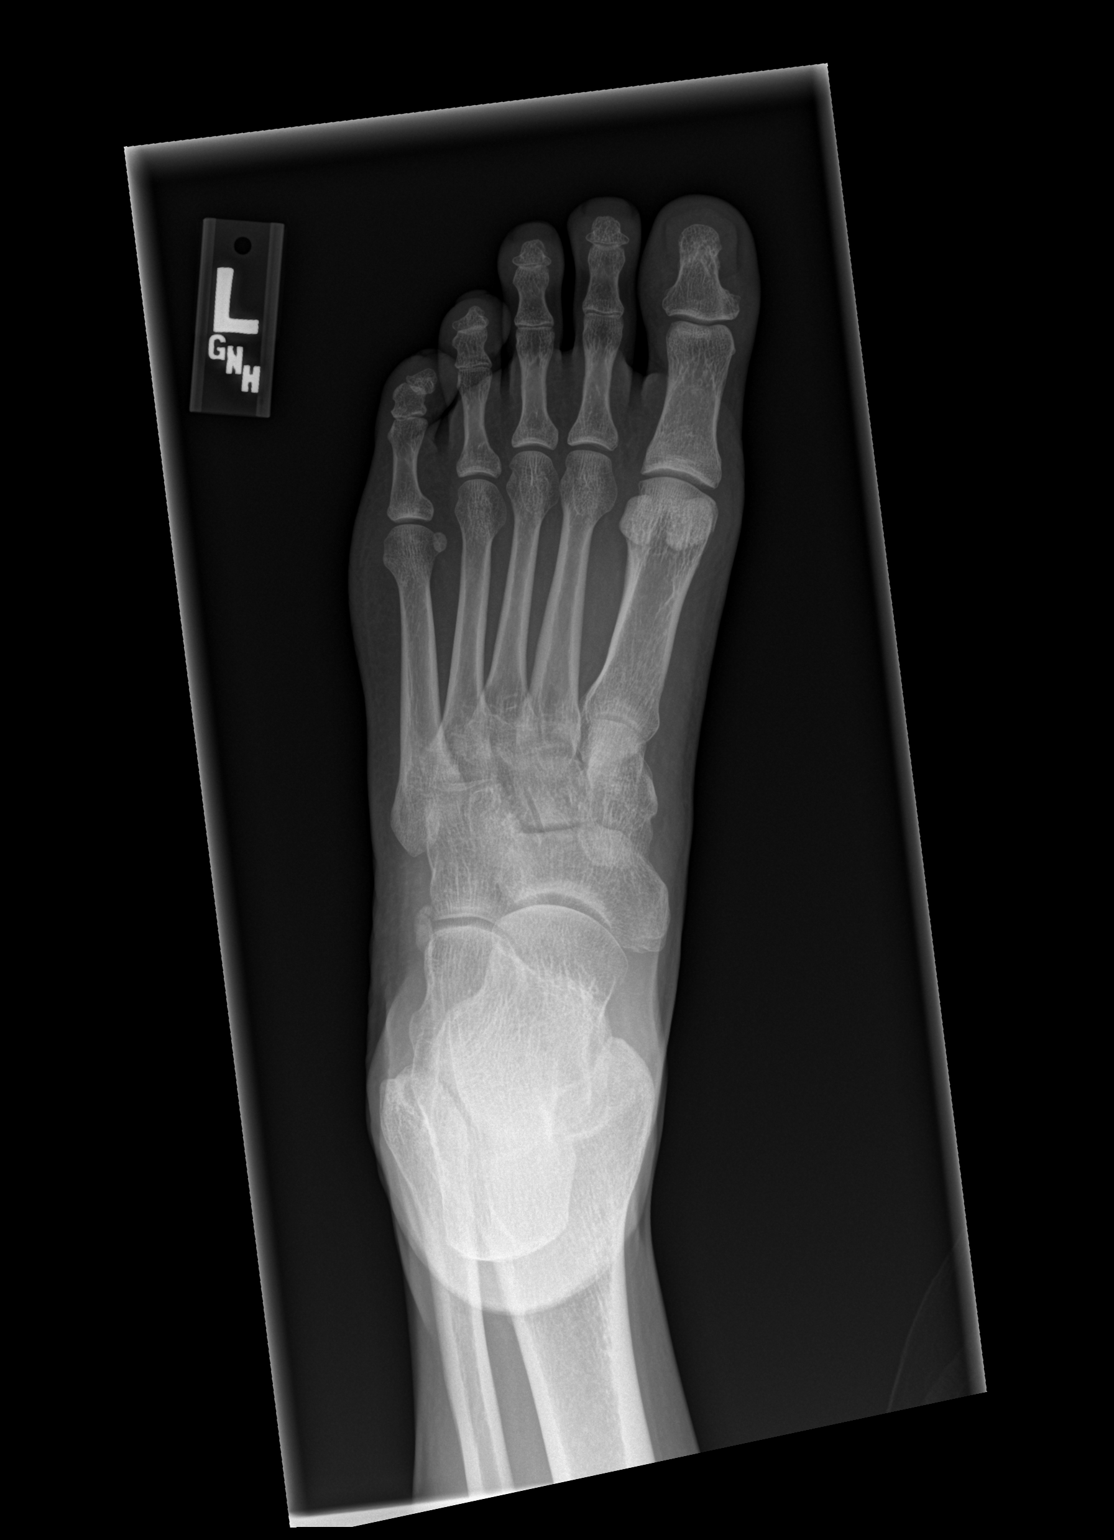

[x foot obl left]
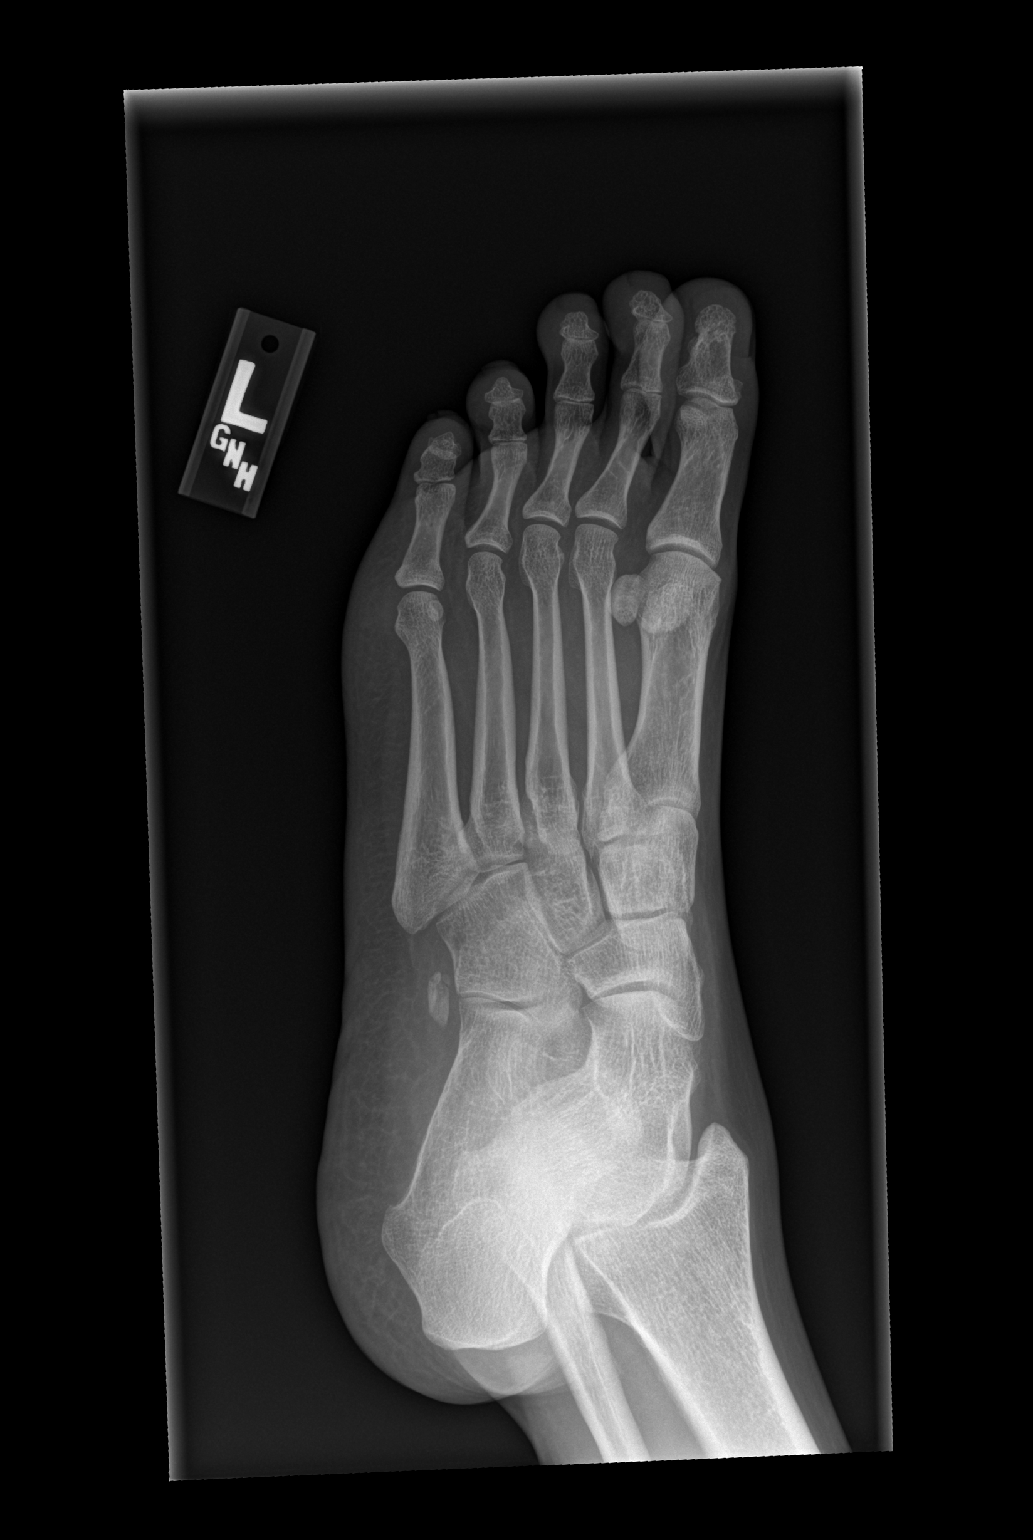

[x foot lat left]
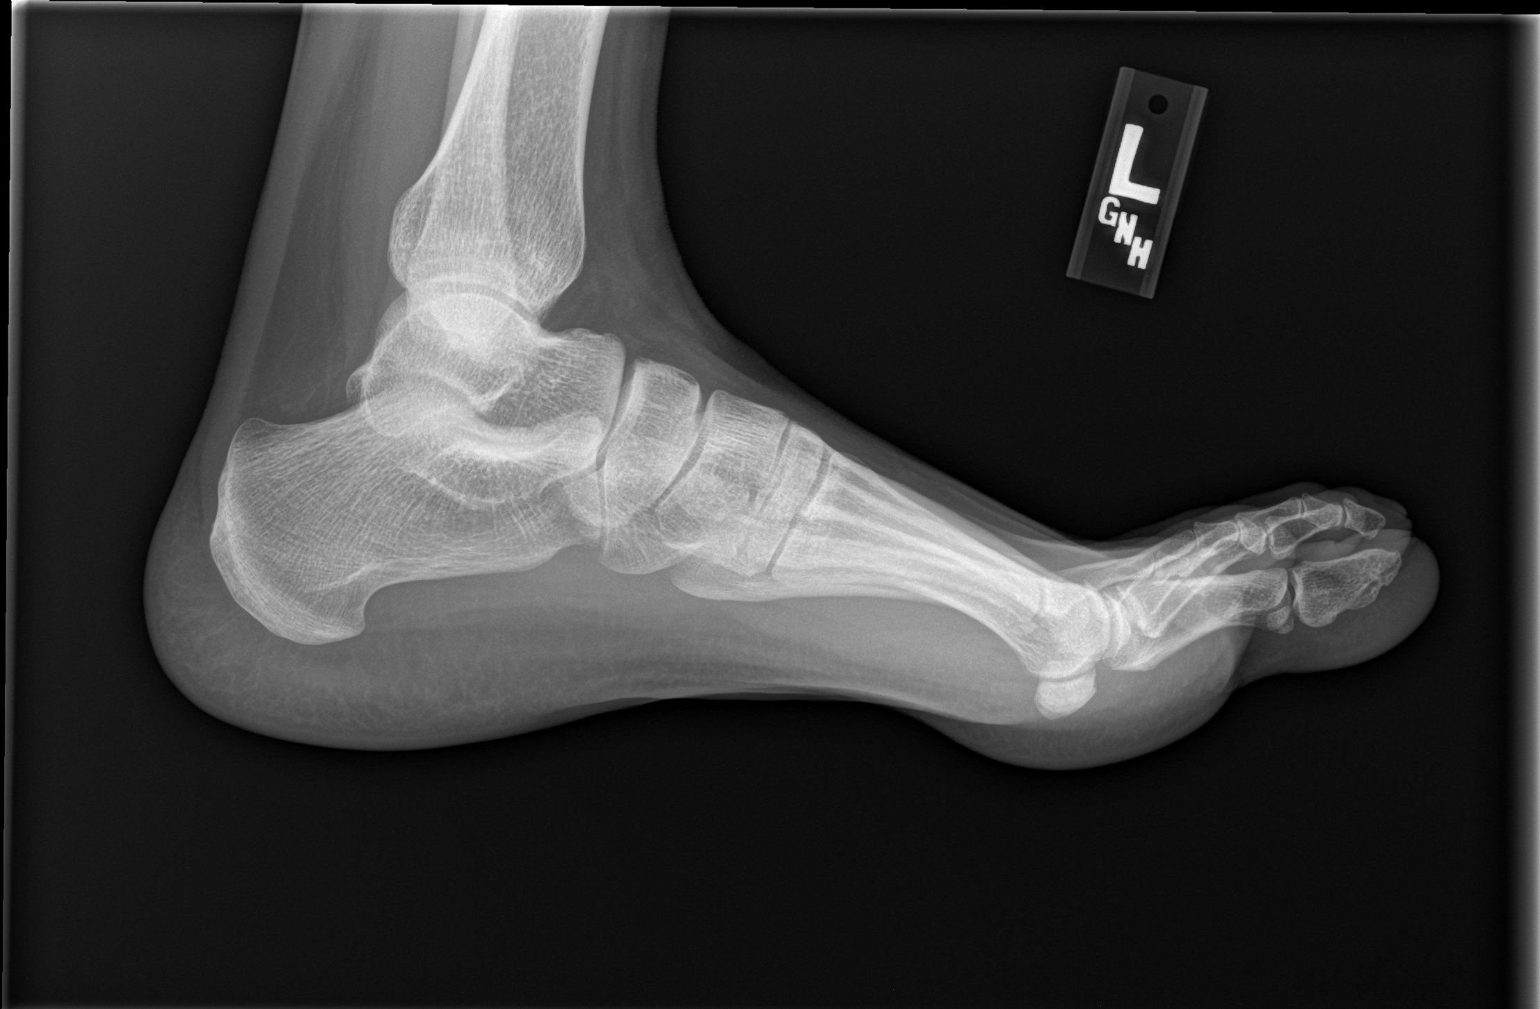

[3 of 3 positions shown; findings below may reference images not displayed]

FINDINGS: There is no evidence of fracture or dislocation. There is no
evidence of arthropathy or other focal bone abnormality. Soft
tissues are unremarkable. No visible foreign bodies.
IMPRESSION: Negative.

## 2020-08-25 ENCOUNTER — Ambulatory Visit: Payer: Medicaid Other

## 2023-09-29 ENCOUNTER — Other Ambulatory Visit: Payer: Self-pay

## 2023-09-29 ENCOUNTER — Inpatient Hospital Stay (HOSPITAL_BASED_OUTPATIENT_CLINIC_OR_DEPARTMENT_OTHER)
Admission: EM | Admit: 2023-09-29 | Discharge: 2023-10-05 | DRG: 815 | Disposition: A | Discharge status: Home / Self Care | Attending: Internal Medicine | Admitting: Internal Medicine

## 2023-09-29 ENCOUNTER — Ambulatory Visit: Payer: BC Managed Care – PPO | Admitting: Family Medicine

## 2023-09-29 ENCOUNTER — Ambulatory Visit: Payer: Self-pay | Admitting: General Practice

## 2023-09-29 ENCOUNTER — Emergency Department (HOSPITAL_BASED_OUTPATIENT_CLINIC_OR_DEPARTMENT_OTHER)

## 2023-09-29 DIAGNOSIS — E876 Hypokalemia: Secondary | ICD-10-CM | POA: Diagnosis present

## 2023-09-29 DIAGNOSIS — Q2112 Patent foramen ovale: Secondary | ICD-10-CM

## 2023-09-29 DIAGNOSIS — Z791 Long term (current) use of non-steroidal anti-inflammatories (NSAID): Secondary | ICD-10-CM

## 2023-09-29 DIAGNOSIS — Z818 Family history of other mental and behavioral disorders: Secondary | ICD-10-CM

## 2023-09-29 DIAGNOSIS — Z87891 Personal history of nicotine dependence: Secondary | ICD-10-CM

## 2023-09-29 DIAGNOSIS — Z825 Family history of asthma and other chronic lower respiratory diseases: Secondary | ICD-10-CM

## 2023-09-29 DIAGNOSIS — I10 Essential (primary) hypertension: Secondary | ICD-10-CM | POA: Diagnosis present

## 2023-09-29 DIAGNOSIS — K30 Functional dyspepsia: Secondary | ICD-10-CM | POA: Diagnosis not present

## 2023-09-29 DIAGNOSIS — R011 Cardiac murmur, unspecified: Secondary | ICD-10-CM | POA: Diagnosis present

## 2023-09-29 DIAGNOSIS — D7389 Other diseases of spleen: Principal | ICD-10-CM | POA: Diagnosis present

## 2023-09-29 DIAGNOSIS — R109 Unspecified abdominal pain: Secondary | ICD-10-CM | POA: Diagnosis not present

## 2023-09-29 DIAGNOSIS — D72829 Elevated white blood cell count, unspecified: Secondary | ICD-10-CM | POA: Diagnosis present

## 2023-09-29 DIAGNOSIS — Z79899 Other long term (current) drug therapy: Secondary | ICD-10-CM

## 2023-09-29 DIAGNOSIS — Z1152 Encounter for screening for COVID-19: Secondary | ICD-10-CM

## 2023-09-29 DIAGNOSIS — J439 Emphysema, unspecified: Secondary | ICD-10-CM | POA: Diagnosis present

## 2023-09-29 DIAGNOSIS — R112 Nausea with vomiting, unspecified: Principal | ICD-10-CM | POA: Diagnosis present

## 2023-09-29 DIAGNOSIS — J40 Bronchitis, not specified as acute or chronic: Secondary | ICD-10-CM | POA: Diagnosis present

## 2023-09-29 DIAGNOSIS — I160 Hypertensive urgency: Secondary | ICD-10-CM | POA: Diagnosis present

## 2023-09-29 DIAGNOSIS — K0251 Dental caries on pit and fissure surface limited to enamel: Secondary | ICD-10-CM | POA: Diagnosis present

## 2023-09-29 DIAGNOSIS — K047 Periapical abscess without sinus: Secondary | ICD-10-CM | POA: Diagnosis present

## 2023-09-29 LAB — URINALYSIS, ROUTINE W REFLEX MICROSCOPIC
Bilirubin Urine: NEGATIVE
Glucose, UA: NEGATIVE mg/dL
Ketones, ur: 40 mg/dL — AB
Leukocytes,Ua: NEGATIVE
Nitrite: NEGATIVE
Protein, ur: >300 mg/dL — AB
RBC / HPF: >50 RBC/hpf (ref 0–5)
Specific Gravity, Urine: 1.032 — ABNORMAL HIGH (ref 1.005–1.030)
pH: 6 (ref 5.0–8.0)

## 2023-09-29 LAB — PREGNANCY, URINE: Preg Test, Ur: NEGATIVE

## 2023-09-29 LAB — COMPREHENSIVE METABOLIC PANEL
ALT: 17 U/L (ref 0–44)
AST: 13 U/L — ABNORMAL LOW (ref 15–41)
Albumin: 4.6 g/dL (ref 3.5–5.0)
Alkaline Phosphatase: 58 U/L (ref 38–126)
Anion gap: 11 (ref 5–15)
BUN: 25 mg/dL — ABNORMAL HIGH (ref 6–20)
CO2: 21 mmol/L — ABNORMAL LOW (ref 22–32)
Calcium: 9.8 mg/dL (ref 8.9–10.3)
Chloride: 103 mmol/L (ref 98–111)
Creatinine, Ser: 1.01 mg/dL — ABNORMAL HIGH (ref 0.44–1.00)
GFR, Estimated: >60 mL/min (ref >60)
Glucose, Bld: 117 mg/dL — ABNORMAL HIGH (ref 70–99)
Potassium: 3.4 mmol/L — ABNORMAL LOW (ref 3.5–5.1)
Sodium: 135 mmol/L (ref 135–145)
Total Bilirubin: 0.6 mg/dL (ref 0.0–1.2)
Total Protein: 8.6 g/dL — ABNORMAL HIGH (ref 6.5–8.1)

## 2023-09-29 LAB — CBC
HCT: 43.1 % (ref 36.0–46.0)
Hemoglobin: 16 g/dL — ABNORMAL HIGH (ref 12.0–15.0)
MCH: 31.4 pg (ref 26.0–34.0)
MCHC: 37.1 g/dL — ABNORMAL HIGH (ref 30.0–36.0)
MCV: 84.5 fL (ref 80.0–100.0)
Platelets: 363 10*3/uL (ref 150–400)
RBC: 5.1 MIL/uL (ref 3.87–5.11)
RDW: 12.9 % (ref 11.5–15.5)
WBC: 13.5 10*3/uL — ABNORMAL HIGH (ref 4.0–10.5)
nRBC: 0 % (ref 0.0–0.2)

## 2023-09-29 LAB — RESP PANEL BY RT-PCR (RSV, FLU A&B, COVID)  RVPGX2
Influenza A by PCR: NEGATIVE
Influenza B by PCR: NEGATIVE
Resp Syncytial Virus by PCR: NEGATIVE
SARS Coronavirus 2 by RT PCR: NEGATIVE

## 2023-09-29 LAB — LACTIC ACID, PLASMA: Lactic Acid, Venous: 1.2 mmol/L (ref 0.5–1.9)

## 2023-09-29 LAB — LIPASE, BLOOD: Lipase: 25 U/L (ref 11–51)

## 2023-09-29 LAB — D-DIMER, QUANTITATIVE: D-Dimer, Quant: 2.03 ug{FEU}/mL — ABNORMAL HIGH (ref 0.00–0.50)

## 2023-09-29 MED ORDER — LACTATED RINGERS IV BOLUS
1000.0000 mL | Freq: Once | INTRAVENOUS | Status: AC
  Administered 2023-09-29: 1000 mL via INTRAVENOUS

## 2023-09-29 MED ORDER — SODIUM CHLORIDE 0.9 % IV SOLN
2.0000 g | Freq: Once | INTRAVENOUS | Status: AC
  Administered 2023-09-29: 2 g via INTRAVENOUS
  Filled 2023-09-29: qty 20

## 2023-09-29 MED ORDER — LACTATED RINGERS IV BOLUS
1000.0000 mL | Freq: Once | INTRAVENOUS | Status: AC
  Administered 2023-09-30: 1000 mL via INTRAVENOUS

## 2023-09-29 MED ORDER — ONDANSETRON HCL 4 MG/2ML IJ SOLN
4.0000 mg | Freq: Once | INTRAMUSCULAR | Status: AC
  Administered 2023-09-29: 4 mg via INTRAVENOUS
  Filled 2023-09-29: qty 2

## 2023-09-29 MED ORDER — LIDOCAINE HCL (PF) 1 % IJ SOLN
2.0000 mL | Freq: Once | INTRAMUSCULAR | Status: AC
  Administered 2023-09-29: 2 mL
  Filled 2023-09-29: qty 5

## 2023-09-29 NOTE — ED Notes (Signed)
 Pt refused second stick to collect second blood culture.

## 2023-09-29 NOTE — ED Triage Notes (Signed)
 Pt bib PTAR reporting elevation sickness, recently returned from [place]. Multiple episodes of n/v this week. Also reporting perimandibular cellulitis, prescribed abx.

## 2023-09-29 NOTE — ED Notes (Signed)
 LR was stopped until rocephin is complete.

## 2023-09-29 NOTE — Telephone Encounter (Signed)
 Copied from CRM [id number]. Topic: Clinical - Red Word Triage >> Sep 29, 2023  5:01 PM Carla L wrote: Red Word that prompted transfer to Nurse Triage: Extremely weak, no energy and stumbling when getting up. No appetite as well.   Was hospitalized in [place], [place] earlier this week due to altitude sickness and high BP 220/180.   Chief Complaint: High blood pressure  Symptoms: Generalized weakness, fatigue, loss of appetite  Frequency: Constant  Disposition: [x] ED /[] Urgent Care (no appt availability in office) / [] Appointment(In office/virtual)/ []  [facility]/ [] Home Care/ [] Refused Recommended Disposition /[] [facility]/ []  Follow-up with PCP Additional Notes: Patient reports that she was recently in [place] and while there became very ill. She reports that she was evaluated and diagnosed with elevation sickness. She states that on the flight home her blood pressure during the flight was 220/180 and that when they landed in [place] she was taken tot he ED and admitted for 3 days. She states that she has still felt sick since arriving back home in [place] with weakness and fatigue and that her blood pressure has remained elevated. Patient checked her blood pressure while on the phone with me and it was 225/254. Patient advised to go to the ED now for evaluation and treatment. Patient understood and is agreeable with this plan.     Reason for Disposition  [1] Systolic BP  >= 160 OR Diastolic >= 100 AND [2] cardiac (e.g., breathing difficulty, chest pain) or neurologic symptoms (e.g., new-onset blurred or double vision, unsteady gait)  Answer Assessment - Initial Assessment Questions 1. DESCRIPTION: "Describe how you are feeling."     Fatigue 2. SEVERITY: "How bad is it?"  "Can you stand and walk?"   - MILD (0-3): Feels weak or tired, but does not interfere with work, school or normal activities.   - MODERATE (4-7): Able to stand and walk; weakness interferes with work,  school, or normal activities.   - SEVERE (8-10): Unable to stand or walk; unable to do usual activities.     Moderate to severe 3. ONSET: "When did these symptoms begin?" (e.g., hours, days, weeks, months)     09/23/23 4. CAUSE: "What do you think is causing the weakness or fatigue?" (e.g., not drinking enough fluids, medical problem, trouble sleeping)     Recently treated for elevation sickness after visiting [place]  5. NEW MEDICINES:  "Have you started on any new medicines recently?" (e.g., opioid pain medicines, benzodiazepines, muscle relaxants, antidepressants, antihistamines, neuroleptics, beta blockers)     No 6. OTHER SYMPTOMS: "Do you have any other symptoms?" (e.g., chest pain, fever, cough, SOB, vomiting, diarrhea, bleeding, other areas of pain)     Loss of appetite, generalized weakness 7. PREGNANCY: "Is there any chance you are pregnant?" "When was your last menstrual period?"     No  Answer Assessment - Initial Assessment Questions 1. BLOOD PRESSURE: "What is the blood pressure?" "Did you take at least two measurements 5 minutes apart?"     225/154 2. ONSET: "When did you take your blood pressure?"     Now 3. HOW: "How did you take your blood pressure?" (e.g., automatic home BP monitor, visiting nurse)     Automatic BP cuff 6. OTHER SYMPTOMS: "Do you have any symptoms?" (e.g., blurred vision, chest pain, difficulty breathing, headache, weakness)     Weakness, fatigue, loss of appetite  Protocols used: Weakness (Generalized) and Fatigue-A-AH, Blood Pressure - High-A-AH

## 2023-09-29 NOTE — ED Provider Notes (Signed)
 [place] EMERGENCY DEPARTMENT AT [facility] Provider Note   CSN: [id number] Arrival date & time: 09/29/23  1832     History  No chief complaint on file.   [name] is a 43 y.o. female.  HPI Denies any significant past medical history.  She reports she went to [facility] on Saturday.  She works for [employer] so she was doing just [employer] and coming back.  She got there on Saturday and went out with some friends.  She reports that she went bowling and had 1-1/2 margaritas.  She reports she was supposed to leave the next morning and at early morning hours she awakened feeling really sick with multiple episodes of vomiting.  She called the EMS and was hydrated and treated for nausea.  She reports her symptoms had improved somewhat but she could not go home on her flight so she was going to go back at 9 PM the next night.  She reports after another few hours the symptoms came back again and again she had multiple episodes of nausea and vomiting requiring hydration and antiemetics.  She reports that she also at that time had swelling or redness of her face and got diagnosed with a dental abscess and started on Augmentin.  Apparently they also talked about altitude sickness and the patient reports when she was being treated she felt better on oxygen.  However she was not doing any high climbing or hiking.  She flew into [place] and was doing usual sitting entertainment activities without continuous altitude increase.  Patient denies she ever at any point had a headache.  She reports that she did not even really have much facial pain in association with the abscess.  Symptoms just came mostly in the form of intense nausea, vomiting and general weakness and fatigue.  She reports when she was seen there she was also noted to be significantly hypertensive.  Apparently there was a thought that she might have underlying hypertension and was started on antihypertensive  medications.  However, patient denies ever previously dealing with headaches, visual changes chest pain.  Patient is currently having a menstrual cycle.  She reports no abnormal volume of bleeding no pelvic pain.    Home Medications Prior to Admission medications   Medication Sig Start Date End Date Taking? Authorizing Provider  acetaminophen (PAIN RELIEVER) 325 MG tablet Take 650 mg by mouth every 6 (six) hours as needed.    [provider]  ibuprofen (ADVIL,MOTRIN) 600 MG tablet Take 1 tablet (600 mg total) by mouth every 6 (six) hours. 07/18/16   Clearance Coots, MD  naproxen (NAPROSYN) 500 MG tablet Take 1 tablet (500 mg total) by mouth 2 (two) times daily. 04/14/18   Law, Waylan Boga, PA-C  Prenatal Vit-Fe Fum-Fe Bisg-FA (NATACHEW) 28-1 MG CHEW Chew 1 tablet by mouth daily. 01/08/14   Katrinka Blazing, [place], CNM  senna-docusate (SENOKOT-S) 8.6-50 MG tablet Take 2 tablets by mouth daily. 07/19/16   Clearance Coots, MD      Allergies    Patient has no known allergies.    Review of Systems   Review of Systems  Physical Exam Updated Vital Signs BP (!) 148/87   Pulse (!) 106   Temp 98.2 F (36.8 C)   Resp 16   Ht 5' 7.5" (1.715 m)   Wt 59 kg   SpO2 98%   BMI 20.06 kg/m  Physical Exam Constitutional:      Comments: Patient appears mildly uncomfortable and ill.  Mental status is clear GCS 15.  No respiratory distress.  HENT:     Head: Normocephalic and atraumatic.     Right Ear: Tympanic membrane normal.     Left Ear: Tympanic membrane normal.     Nose: Nose normal.     Mouth/Throat:     Mouth: Mucous membranes are moist.     Pharynx: Oropharynx is clear.     Comments: Patient does have fluctuant palpable abscess at the base of the inferior right canine.  Posterior airway widely patent.  No erythema or exudate of the posterior airway. Eyes:     Extraocular Movements: Extraocular movements intact.     Pupils: Pupils are equal, round, and reactive to light.     Comments: Sclera  looks slightly icteric.  Neck:     Comments: No thyromegaly. Cardiovascular:     Rate and Rhythm: Regular rhythm. Tachycardia present.     Pulses: Normal pulses.  Pulmonary:     Effort: Pulmonary effort is normal.     Breath sounds: Normal breath sounds.  Abdominal:     General: There is no distension.     Palpations: Abdomen is soft.     Tenderness: There is no abdominal tenderness. There is no guarding.     Comments: No hepatosplenomegaly.  Musculoskeletal:        General: No swelling or tenderness. Normal range of motion.     Cervical back: Neck supple. No rigidity.     Right lower leg: No edema.     Left lower leg: No edema.     Comments: No joint swellings or effusions.  Digits are warm and dry.  No rashes on the digits no abnormal nails.  Lymphadenopathy:     Cervical: No cervical adenopathy.  Skin:    General: Skin is warm and dry.     Findings: No rash.  Neurological:     General: No focal deficit present.     Mental Status: She is oriented to person, place, and time.     Motor: No weakness.     Coordination: Coordination normal.     Comments: No focal weakness.  Cognitive function normal.  No incoordination.  Psychiatric:        Mood and Affect: Mood normal.     ED Results / Procedures / Treatments   Labs (all labs ordered are listed, but only abnormal results are displayed) Labs Reviewed  COMPREHENSIVE METABOLIC PANEL - Abnormal; Notable for the following components:      Result Value   Potassium 3.4 (*)    CO2 21 (*)    Glucose, Bld 117 (*)    BUN 25 (*)    Creatinine, Ser 1.01 (*)    Total Protein 8.6 (*)    AST 13 (*)    All other components within normal limits  CBC - Abnormal; Notable for the following components:   WBC 13.5 (*)    Hemoglobin 16.0 (*)    MCHC 37.1 (*)    All other components within normal limits  URINALYSIS, ROUTINE W REFLEX MICROSCOPIC - Abnormal; Notable for the following components:   Specific Gravity, Urine 1.032 (*)    Hgb  urine dipstick LARGE (*)    Ketones, ur 40 (*)    Protein, ur >300 (*)    Bacteria, UA FEW (*)    All other components within normal limits  RESP PANEL BY RT-PCR (RSV, FLU A&B, COVID)  RVPGX2  CULTURE, BLOOD (ROUTINE X 2)  CULTURE, BLOOD (ROUTINE  X 2)  LIPASE, BLOOD  PREGNANCY, URINE  LACTIC ACID, PLASMA  D-DIMER, QUANTITATIVE  RAPID URINE DRUG SCREEN, HOSP PERFORMED    EKG None  Radiology DG Chest Port 1 View Result Date: 09/29/2023 CLINICAL DATA:  vomiting EXAM: PORTABLE CHEST 1 VIEW COMPARISON:  None Available. FINDINGS: The cardiomediastinal contours are normal. The lungs are clear. Pulmonary vasculature is normal. No consolidation, pleural effusion, or pneumothorax. No acute osseous abnormalities are seen. IMPRESSION: No active disease. Electronically Signed   By: Narda Rutherford M.D.   On: 09/29/2023 23:39    Procedures Procedures    Medications Ordered in ED Medications  lactated ringers bolus 1,000 mL (has no administration in time range)  ondansetron (ZOFRAN) injection 4 mg (4 mg Intravenous Given 09/29/23 1921)  lactated ringers bolus 1,000 mL (1,000 mLs Intravenous New Bag/Given 09/29/23 2155)  cefTRIAXone (ROCEPHIN) 2 g in sodium chloride 0.9 % 100 mL IVPB (0 g Intravenous Stopped 09/29/23 2237)  lidocaine (PF) (XYLOCAINE) 1 % injection 2 mL (2 mLs Other Given 09/29/23 2326)    ED Course/ Medical Decision Making/ A&P                                 Medical Decision Making Amount and/or Complexity of Data Reviewed Labs: ordered. Radiology: ordered.  Risk Prescription drug management. Decision regarding hospitalization.   Patient presents as outlined.  She has been dealing with some general constitutional symptoms for 5 to 6 days.  Symptoms started after traveling to [facility] with intense nausea and vomiting several episodes.  Apparently during her assessment and treatment at that time there was discussion of altitude sickness.  Clinically, this seems of  lower probability.  Patient denies that she ever had a headache and did not have dyspnea.  Symptoms started mostly with aggressive vomiting.  Patient was not hiking or ascending she stayed in [place] for approximately 48 hours and has remained generally ill since returning to this area for an additional 3 to 4 days.  Differential diagnosis includes other viral syndrome\adverse reaction to Augmentin\persistent dehydration\indolent bacterial infection possibly from dental abscess.  Proceed with broad diagnostic evaluation and hydration.  Will administer 2 g Rocephin for dental infection.  Clinically however, there is a palpable abscess at the base of the tooth but patient does not have extensive facial swelling nor tenderness of the jaw.  Clinically this does not appear to need CT scan for further delineation.  Patient is got good range of motion with no trismus and no other significant soft tissue swellings of the floor the mouth.  Aspiratory panel negative urinalysis greater than 50 RBCs 11-20 WBC (patient currently on menstrual cycle denies symptoms) lactic acid 1.2 urine pregnancy negative BUN 25 creatinine 1.0 GFR greater than 60.  Potassium 3.4.  LFTs normal.  Lipase 25.  White count 13.5.  H&H 16 and 43.  Platelets 363.  Procedure: I did I&D patient's dental abscess with 1% lidocaine anesthesia and 11 blade.  Moderate amount of purulent drainage expressed.  Patient tolerated well.  I had a patient swish with half hydroperoxide half water.  I personally reviewed chest x-ray.  I do not appreciate consolidation or infiltrate.  Radiology reviewed no active disease.  At this time, suspect dehydration.  It appears patient has a concentration with hemoglobin at 16, some persistent ketones in the urine.  Patient does have protein in blood however she is currently having a menstrual cycle and  does not have symptoms.  This will need to be rechecked.  Lactic acid at 1.2 does not suggest sepsis.  Patient  has been hypertensive on arrival but blood pressures have been trending down most recent pressure 148/87.  There has been a question during previous evaluations of possible underlying hypertension.  Patient does not endorse any symptoms of endorgan damage.  She denies she has ever had a headache with any of these symptoms and has not been dealing with chest pain or shortness of breath.  This will require continued monitoring.  Patient does have D-dimer 2.0.  Will need to proceed with CT PE study and will also include abdomen.  Patient denies abdominal pain but is had recurrent episodes of aggressive vomiting.  Will plan to have Dr. Thedore Mins follow-up on PE study and will continue to hydrate.  Anticipate patient may be appropriate for discharge if remainder of diagnostic workup is negative and vital signs remained stable.  Patient has had 2 g of Rocephin for dental infection.  She will not need additional antibiotics for 24 hours.  I at that time I would have her continue her Augmentin if nausea and vomiting have not recurred.           Final Clinical Impression(s) / ED Diagnoses Final diagnoses:  Nausea and vomiting, unspecified vomiting type  Splenic lesion    Rx / DC Orders ED Discharge Orders     None         Arby Barrette, MD 10/01/23 1736

## 2023-09-30 ENCOUNTER — Inpatient Hospital Stay (HOSPITAL_COMMUNITY)

## 2023-09-30 ENCOUNTER — Encounter (HOSPITAL_COMMUNITY): Payer: Self-pay | Admitting: Family Medicine

## 2023-09-30 ENCOUNTER — Emergency Department (HOSPITAL_BASED_OUTPATIENT_CLINIC_OR_DEPARTMENT_OTHER)

## 2023-09-30 DIAGNOSIS — I160 Hypertensive urgency: Secondary | ICD-10-CM | POA: Diagnosis present

## 2023-09-30 DIAGNOSIS — K0251 Dental caries on pit and fissure surface limited to enamel: Secondary | ICD-10-CM | POA: Diagnosis present

## 2023-09-30 DIAGNOSIS — Z87891 Personal history of nicotine dependence: Secondary | ICD-10-CM | POA: Diagnosis not present

## 2023-09-30 DIAGNOSIS — K047 Periapical abscess without sinus: Secondary | ICD-10-CM | POA: Diagnosis present

## 2023-09-30 DIAGNOSIS — R109 Unspecified abdominal pain: Secondary | ICD-10-CM | POA: Diagnosis present

## 2023-09-30 DIAGNOSIS — I10 Essential (primary) hypertension: Secondary | ICD-10-CM | POA: Diagnosis present

## 2023-09-30 DIAGNOSIS — E876 Hypokalemia: Secondary | ICD-10-CM | POA: Diagnosis present

## 2023-09-30 DIAGNOSIS — Z825 Family history of asthma and other chronic lower respiratory diseases: Secondary | ICD-10-CM | POA: Diagnosis not present

## 2023-09-30 DIAGNOSIS — Z791 Long term (current) use of non-steroidal anti-inflammatories (NSAID): Secondary | ICD-10-CM | POA: Diagnosis not present

## 2023-09-30 DIAGNOSIS — R011 Cardiac murmur, unspecified: Secondary | ICD-10-CM | POA: Diagnosis present

## 2023-09-30 DIAGNOSIS — Z1152 Encounter for screening for COVID-19: Secondary | ICD-10-CM | POA: Diagnosis not present

## 2023-09-30 DIAGNOSIS — Q2112 Patent foramen ovale: Secondary | ICD-10-CM | POA: Diagnosis not present

## 2023-09-30 DIAGNOSIS — I371 Nonrheumatic pulmonary valve insufficiency: Secondary | ICD-10-CM | POA: Diagnosis not present

## 2023-09-30 DIAGNOSIS — J439 Emphysema, unspecified: Secondary | ICD-10-CM | POA: Diagnosis present

## 2023-09-30 DIAGNOSIS — D7389 Other diseases of spleen: Secondary | ICD-10-CM | POA: Diagnosis present

## 2023-09-30 DIAGNOSIS — R112 Nausea with vomiting, unspecified: Secondary | ICD-10-CM | POA: Diagnosis present

## 2023-09-30 DIAGNOSIS — Z818 Family history of other mental and behavioral disorders: Secondary | ICD-10-CM | POA: Diagnosis not present

## 2023-09-30 DIAGNOSIS — D72829 Elevated white blood cell count, unspecified: Secondary | ICD-10-CM | POA: Diagnosis present

## 2023-09-30 DIAGNOSIS — J40 Bronchitis, not specified as acute or chronic: Secondary | ICD-10-CM | POA: Diagnosis present

## 2023-09-30 DIAGNOSIS — Z79899 Other long term (current) drug therapy: Secondary | ICD-10-CM | POA: Diagnosis not present

## 2023-09-30 DIAGNOSIS — K30 Functional dyspepsia: Secondary | ICD-10-CM | POA: Diagnosis not present

## 2023-09-30 LAB — RAPID URINE DRUG SCREEN, HOSP PERFORMED
Amphetamines: NOT DETECTED
Barbiturates: NOT DETECTED
Benzodiazepines: POSITIVE — AB
Cocaine: NOT DETECTED
Opiates: NOT DETECTED
Tetrahydrocannabinol: POSITIVE — AB

## 2023-09-30 LAB — C-REACTIVE PROTEIN: CRP: 0.7 mg/dL (ref <1.0)

## 2023-09-30 LAB — BASIC METABOLIC PANEL
Anion gap: 11 (ref 5–15)
BUN: 15 mg/dL (ref 6–20)
CO2: 20 mmol/L — ABNORMAL LOW (ref 22–32)
Calcium: 9 mg/dL (ref 8.9–10.3)
Chloride: 105 mmol/L (ref 98–111)
Creatinine, Ser: 0.98 mg/dL (ref 0.44–1.00)
GFR, Estimated: >60 mL/min (ref >60)
Glucose, Bld: 103 mg/dL — ABNORMAL HIGH (ref 70–99)
Potassium: 3.4 mmol/L — ABNORMAL LOW (ref 3.5–5.1)
Sodium: 136 mmol/L (ref 135–145)

## 2023-09-30 LAB — ECHOCARDIOGRAM COMPLETE
Area-P 1/2: 3.35 cm2
Height: 67 in
S' Lateral: 2.7 cm
Weight: 2264 [oz_av]

## 2023-09-30 LAB — MAGNESIUM: Magnesium: 1.8 mg/dL (ref 1.7–2.4)

## 2023-09-30 LAB — CBC
HCT: 36.7 % (ref 36.0–46.0)
Hemoglobin: 13.7 g/dL (ref 12.0–15.0)
MCH: 31.1 pg (ref 26.0–34.0)
MCHC: 37.3 g/dL — ABNORMAL HIGH (ref 30.0–36.0)
MCV: 83.2 fL (ref 80.0–100.0)
Platelets: 298 10*3/uL (ref 150–400)
RBC: 4.41 MIL/uL (ref 3.87–5.11)
RDW: 12.7 % (ref 11.5–15.5)
WBC: 14.8 10*3/uL — ABNORMAL HIGH (ref 4.0–10.5)
nRBC: 0 % (ref 0.0–0.2)

## 2023-09-30 LAB — PROTIME-INR
INR: 1.1 (ref 0.8–1.2)
Prothrombin Time: 14.8 s (ref 11.4–15.2)

## 2023-09-30 LAB — HIV ANTIBODY (ROUTINE TESTING W REFLEX): HIV Screen 4th Generation wRfx: NONREACTIVE

## 2023-09-30 LAB — SEDIMENTATION RATE: Sed Rate: 40 mm/h — ABNORMAL HIGH (ref 0–22)

## 2023-09-30 MED ORDER — GADOBUTROL 1 MMOL/ML IV SOLN
6.0000 mL | Freq: Once | INTRAVENOUS | Status: AC | PRN
Start: 2023-09-30 — End: 2023-09-30
  Administered 2023-09-30: 6 mL via INTRAVENOUS

## 2023-09-30 MED ORDER — ONDANSETRON HCL 4 MG/2ML IJ SOLN
4.0000 mg | Freq: Once | INTRAMUSCULAR | Status: AC
  Administered 2023-09-30: 4 mg via INTRAVENOUS
  Filled 2023-09-30: qty 2

## 2023-09-30 MED ORDER — SODIUM CHLORIDE 0.9 % IV SOLN
2.0000 g | INTRAVENOUS | Status: DC
  Administered 2023-09-30 – 2023-10-01 (×2): 2 g via INTRAVENOUS
  Filled 2023-09-30 (×2): qty 20

## 2023-09-30 MED ORDER — BOOST / RESOURCE BREEZE PO LIQD CUSTOM
1.0000 | Freq: Three times a day (TID) | ORAL | Status: DC
  Administered 2023-10-01 – 2023-10-04 (×2): 1 via ORAL

## 2023-09-30 MED ORDER — POTASSIUM CHLORIDE CRYS ER 20 MEQ PO TBCR
20.0000 meq | EXTENDED_RELEASE_TABLET | Freq: Once | ORAL | Status: DC
  Filled 2023-09-30: qty 1

## 2023-09-30 MED ORDER — OXYCODONE HCL 5 MG PO TABS: 5.0000 mg | ORAL_TABLET | ORAL | Status: DC | PRN

## 2023-09-30 MED ORDER — POTASSIUM CHLORIDE CRYS ER 20 MEQ PO TBCR
20.0000 meq | EXTENDED_RELEASE_TABLET | Freq: Once | ORAL | Status: DC
  Administered 2023-09-30: 20 meq via ORAL
  Filled 2023-09-30: qty 1

## 2023-09-30 MED ORDER — ONDANSETRON HCL 4 MG PO TABS: 4.0000 mg | ORAL_TABLET | Freq: Four times a day (QID) | ORAL | Status: DC | PRN

## 2023-09-30 MED ORDER — VANCOMYCIN HCL 1250 MG/250ML IV SOLN
1250.0000 mg | INTRAVENOUS | Status: DC
  Administered 2023-09-30 – 2023-10-01 (×2): 1250 mg via INTRAVENOUS
  Filled 2023-09-30 (×4): qty 250

## 2023-09-30 MED ORDER — IOHEXOL 350 MG/ML SOLN
100.0000 mL | Freq: Once | INTRAVENOUS | Status: AC | PRN
  Administered 2023-09-30: 100 mL via INTRAVENOUS

## 2023-09-30 MED ORDER — HYDRALAZINE HCL 20 MG/ML IJ SOLN
10.0000 mg | Freq: Four times a day (QID) | INTRAMUSCULAR | Status: DC | PRN
Start: 2023-09-30 — End: 2023-10-04

## 2023-09-30 MED ORDER — POLYETHYLENE GLYCOL 3350 17 G PO PACK: 17.0000 g | PACK | Freq: Every day | ORAL | Status: DC | PRN

## 2023-09-30 MED ORDER — SODIUM CHLORIDE 0.9% FLUSH
3.0000 mL | Freq: Two times a day (BID) | INTRAVENOUS | Status: DC
  Administered 2023-09-30 – 2023-10-04 (×7): 3 mL via INTRAVENOUS

## 2023-09-30 MED ORDER — HYDRALAZINE HCL 25 MG PO TABS
25.0000 mg | ORAL_TABLET | Freq: Four times a day (QID) | ORAL | Status: DC | PRN
  Administered 2023-09-30 – 2023-10-04 (×3): 25 mg via ORAL
  Filled 2023-09-30 (×3): qty 1

## 2023-09-30 MED ORDER — ONDANSETRON HCL 4 MG/2ML IJ SOLN
4.0000 mg | Freq: Four times a day (QID) | INTRAMUSCULAR | Status: DC | PRN
  Administered 2023-09-30 – 2023-10-03 (×2): 4 mg via INTRAVENOUS
  Filled 2023-09-30 (×2): qty 2

## 2023-09-30 MED ORDER — ACETAMINOPHEN 325 MG PO TABS: 650.0000 mg | ORAL_TABLET | Freq: Four times a day (QID) | ORAL | Status: DC | PRN

## 2023-09-30 MED ORDER — VANCOMYCIN HCL IN DEXTROSE 1-5 GM/200ML-% IV SOLN
1000.0000 mg | Freq: Once | INTRAVENOUS | Status: AC
  Administered 2023-09-30: 1000 mg via INTRAVENOUS
  Filled 2023-09-30: qty 200

## 2023-09-30 MED ORDER — ACETAMINOPHEN 650 MG RE SUPP: 650.0000 mg | Freq: Four times a day (QID) | RECTAL | Status: DC | PRN

## 2023-09-30 MED ORDER — ENOXAPARIN SODIUM 40 MG/0.4ML IJ SOSY
40.0000 mg | PREFILLED_SYRINGE | INTRAMUSCULAR | Status: DC
  Administered 2023-09-30 – 2023-10-03 (×4): 40 mg via SUBCUTANEOUS
  Filled 2023-09-30 (×5): qty 0.4

## 2023-09-30 NOTE — Hospital Course (Signed)
 [name] is a 43 y.o. female with medical history significant for depression and ADHD presented to hospital with elevated blood pressure nausea and vomiting.  Patient stated that she was okay until 09/23/2023 when she had alcoholic beverages and marijuana subsequently started having nausea and recurrent vomiting at that time EMS was called in and received IV fluids and antiemetics with some improvement but subsequently got worse and then continued to experience nausea and vomiting and fatigue.  In the ED patient was tachycardic with elevated blood pressure.  Labs showed hypokalemia with potassium of 3.4 and WBC was elevated at 13.5 K.  Respiratory viral panel was negative.  CTA of the chest was negative for PE.  CT scan of the abdomen pelvis showed diffuse  hypodense splenic lesions, some with rim enhancement.  Urinalysis showed protein with 11-20 white cells.  No nitrites.  Lactate was 1.2.  Pregnancy test was negative.  Lipase was 25.  Blood cultures were collected and patient was given 2 L of Ringer lactate solution vancomycin and Rocephin and was out of the hospital for further evaluation and treatment.  Splenic lesions. Uncertain etiology.  Continue empiric antibiotic with vancomycin and Rocephin..  Follow cultures.  Urine drug screen positive for benzodiazepines and THC.  D-dimer was elevated.  Check HIV.  Follow blood cultures.  Check 2D echocardiogram.  Check MRI of the abdomen.   Hypertensive urgency  Not on antihypertensives at home.   Hypokalemia  Replenished.  Initial potassium of 3.4.  Mild leukocytosis.  Initial WBC at 13.5.  Will continue to monitor.  No fever.   Dental abscess  - S/p I&D in ED, does not appear to have any significant surrounding cellulitis.  Continue with antibiotic.

## 2023-09-30 NOTE — ED Notes (Signed)
 Report given to Nurse Toni Amend, RN

## 2023-09-30 NOTE — Progress Notes (Addendum)
 PROGRESS NOTE  [name] ZHY:[id number] DOB: [birth year] DOA: 09/29/2023 PCP: Patient, No Pcp Per   LOS: 0 days   Brief narrative:  [name] is a 43 y.o. female with medical history significant for depression and ADHD presented to hospital with elevated blood pressure nausea and vomiting.  Patient stated that she was okay until 09/23/2023 when she had alcoholic beverages and marijuana subsequently started having nausea and recurrent vomiting at that time EMS was called in and received IV fluids and antiemetics with some improvement but subsequently got worse and then continued to experience nausea and vomiting and fatigue.  In the ED patient was tachycardic with elevated blood pressure.  Labs showed hypokalemia with potassium of 3.4 and WBC was elevated at 13.5 K.  Respiratory viral panel was negative.  CTA of the chest was negative for PE.  CT scan of the abdomen pelvis showed diffuse  hypodense splenic lesions, some with rim enhancement.  Urinalysis showed protein with 11-20 white cells.  No nitrites.  Lactate was 1.2.  D-dimer was elevated more than 2 so CT angiogram of the chest was performed which showed no evidence of pulmonary embolism but apical emphysema with mild bronchitis and hypodense lesions in the spleen some swelling remained has been.  Pregnancy test was negative.  Lipase was 25.  Blood cultures were collected and patient was given 2 L of Ringer lactate solution vancomycin and Rocephin and was out of the hospital for further evaluation and treatment.     Assessment/Plan: Principal Problem:   Splenic lesion Active Problems:   Hypertensive urgency   Hypokalemia   Dental abscess  Splenic lesions. Uncertain etiology.  Multiple hypodense lesions with rim enhancement differentials including splenic lymphoma infectious process could be embolic lesions from infective endocarditis.  Was noted to have new heart murmur. Continue empiric antibiotic with vancomycin and  Rocephin..  Follow blood cultures.  Urine drug screen positive for benzodiazepines and THC.  D-dimer was elevated.  CT angiogram of the chest however negative for PE.  Check HIV.  Follow blood cultures.  Check 2D echocardiogram.  Check MRI of the abdomen.  Cannot rule out infective lesions from recent dental infection.  Nausea vomiting.  None further.  Will continue to treat symptomatically.   Hypertensive urgency  Not on antihypertensives at home.   Hypokalemia  Replenished.  Initial potassium of 3.4.  Will continue to replace orally.  Mild leukocytosis.  Initial WBC at 13.5.  Will continue to monitor.  No fever.  Check CBC in AM.   Dental abscess  Stable for I&D.  Improving.  Continue with antibiotic.  Patient stated that she had been having problems for the last 2 years and a that she was told to have root canal treatment but still has not had a chance.  Cannabis use.  Counseled about it.  No nausea vomiting.  DVT prophylaxis: enoxaparin (LOVENOX) injection 40 mg Start: 09/30/23 1000   Disposition: Likely home in 1 to 2 days  Status is: Inpatient Remains inpatient appropriate because: Pending clinical improvement, further workup, IV antibiotic,    Code Status:     Code Status: Full Code  Family Communication: None at bedside  Consultants: None  Procedures: None yet  Anti-infectives:  Rocephin and vancomycin  Anti-infectives (From admission, onward)    Start     Dose/Rate Route Frequency Ordered Stop   09/30/23 2200  cefTRIAXone (ROCEPHIN) 2 g in sodium chloride 0.9 % 100 mL IVPB        2 g  200 mL/hr over 30 Minutes Intravenous Every 24 hours 09/30/23 0326     09/30/23 2200  vancomycin (VANCOREADY) IVPB 1250 mg/250 mL        1,250 mg 166.7 mL/hr over 90 Minutes Intravenous Every 24 hours 09/30/23 0358     09/30/23 0300  vancomycin (VANCOCIN) IVPB 1000 mg/200 mL premix        1,000 mg 200 mL/hr over 60 Minutes Intravenous  Once 09/30/23 0246 09/30/23 0530    09/29/23 2200  cefTRIAXone (ROCEPHIN) 2 g in sodium chloride 0.9 % 100 mL IVPB        2 g 200 mL/hr over 30 Minutes Intravenous  Once 09/29/23 2153 09/29/23 2237       Subjective: Today, patient was seen and examined at bedside.  Denies any nausea vomiting but mild oral discomfort.  Intermittent discomfort in the abdomen especially when moving.  No no fever chills or rigor.  Denies any shortness of breath cough or congestion.  Objective: Vitals:   09/30/23 0503 09/30/23 0931  BP: (!) 163/102 (!) 178/119  Pulse: (!) 107 96  Resp: 18 18  Temp: 97.7 F (36.5 C) 97.9 F (36.6 C)  SpO2: 100% 100%    Intake/Output Summary (Last 24 hours) at 09/30/2023 1129 Last data filed at 09/30/2023 0655 Gross per 24 hour  Intake 1959.81 ml  Output 0 ml  Net 1959.81 ml   Filed Weights   09/29/23 1841 09/30/23 0456  Weight: 59 kg 64.2 kg   Body mass index is 22.16 kg/m.   Physical Exam:  GENERAL: Patient is alert awake and oriented. Not in obvious distress. HENT: No scleral pallor or icterus. Pupils equally reactive to light. Oral mucosa is moist NECK: is supple, no gross swelling noted. CHEST: Clear to auscultation. No crackles or wheezes.  Diminished breath sounds bilaterally. CVS: S1 and S2 heard, no murmur. Regular rate and rhythm.  ABDOMEN: Soft, non-tender, bowel sounds are present. EXTREMITIES: No edema. CNS: Cranial nerves are intact. No focal motor deficits. SKIN: warm and dry without rashes.  Data Review: I have personally reviewed the following laboratory data and studies,  CBC: Recent Labs  Lab 09/29/23 1921 09/30/23 0759  WBC 13.5* 14.8*  HGB 16.0* 13.7  HCT 43.1 36.7  MCV 84.5 83.2  PLT 363 298   Basic Metabolic Panel: Recent Labs  Lab 09/29/23 1921 09/30/23 0759  NA 135 136  K 3.4* 3.4*  CL 103 105  CO2 21* 20*  GLUCOSE 117* 103*  BUN 25* 15  CREATININE 1.01* 0.98  CALCIUM 9.8 9.0  MG  --  1.8   Liver Function Tests: Recent Labs  Lab 09/29/23 1921   AST 13*  ALT 17  ALKPHOS 58  BILITOT 0.6  PROT 8.6*  ALBUMIN 4.6   Recent Labs  Lab 09/29/23 1921  LIPASE 25   No results for input(s): "AMMONIA" in the last 168 hours. Cardiac Enzymes: No results for input(s): "CKTOTAL", "CKMB", "CKMBINDEX", "TROPONINI" in the last 168 hours. BNP (last 3 results) No results for input(s): "BNP" in the last 8760 hours.  ProBNP (last 3 results) No results for input(s): "PROBNP" in the last 8760 hours.  CBG: No results for input(s): "GLUCAP" in the last 168 hours. Recent Results (from the past 240 hours)  Resp panel by RT-PCR (RSV, Flu A&B, Covid) Urine, Clean Catch     Status: None   Collection Time: 09/29/23  9:33 PM   Specimen: Urine, Clean Catch; Nasal Swab  Result Value Ref Range Status  SARS Coronavirus 2 by RT PCR NEGATIVE NEGATIVE Final    Comment: (NOTE) SARS-CoV-2 target nucleic acids are NOT DETECTED.  The SARS-CoV-2 RNA is generally detectable in upper respiratory specimens during the acute phase of infection. The lowest concentration of SARS-CoV-2 viral copies this assay can detect is 138 copies/mL. A negative result does not preclude SARS-Cov-2 infection and should not be used as the sole basis for treatment or other patient management decisions. A negative result may occur with  improper specimen collection/handling, submission of specimen other than nasopharyngeal swab, presence of viral mutation(s) within the areas targeted by this assay, and inadequate number of viral copies(<138 copies/mL). A negative result must be combined with clinical observations, patient history, and epidemiological information. The expected result is Negative.  Fact Sheet for Patients:  BloggerCourse.com  Fact Sheet for Healthcare Providers:  SeriousBroker.it  This test is no t yet approved or cleared by the [place] FDA and  has been authorized for detection and/or diagnosis of  SARS-CoV-2 by FDA under an Emergency Use Authorization (EUA). This EUA will remain  in effect (meaning this test can be used) for the duration of the COVID-19 declaration under Section 564(b)(1) of the Act, 21 U.S.C.section 360bbb-3(b)(1), unless the authorization is terminated  or revoked sooner.       Influenza A by PCR NEGATIVE NEGATIVE Final   Influenza B by PCR NEGATIVE NEGATIVE Final    Comment: (NOTE) The Xpert Xpress SARS-CoV-2/FLU/RSV plus assay is intended as an aid in the diagnosis of influenza from Nasopharyngeal swab specimens and should not be used as a sole basis for treatment. Nasal washings and aspirates are unacceptable for Xpert Xpress SARS-CoV-2/FLU/RSV testing.  Fact Sheet for Patients: BloggerCourse.com  Fact Sheet for Healthcare Providers: SeriousBroker.it  This test is not yet approved or cleared by the [place] FDA and has been authorized for detection and/or diagnosis of SARS-CoV-2 by FDA under an Emergency Use Authorization (EUA). This EUA will remain in effect (meaning this test can be used) for the duration of the COVID-19 declaration under Section 564(b)(1) of the Act, 21 U.S.C. section 360bbb-3(b)(1), unless the authorization is terminated or revoked.     Resp Syncytial Virus by PCR NEGATIVE NEGATIVE Final    Comment: (NOTE) Fact Sheet for Patients: BloggerCourse.com  Fact Sheet for Healthcare Providers: SeriousBroker.it  This test is not yet approved or cleared by the [place] FDA and has been authorized for detection and/or diagnosis of SARS-CoV-2 by FDA under an Emergency Use Authorization (EUA). This EUA will remain in effect (meaning this test can be used) for the duration of the COVID-19 declaration under Section 564(b)(1) of the Act, 21 U.S.C. section 360bbb-3(b)(1), unless the authorization is terminated  or revoked.  Performed at [facility], [address], [place], [place] [address]      Studies: CT Angio Chest PE W/Cm &/Or Wo Cm Result Date: 09/30/2023 CLINICAL DATA:  Positive D-dimer with nausea, vomiting and abdominal pain. EXAM: CT ANGIOGRAPHY CHEST CT ABDOMEN AND PELVIS WITH CONTRAST TECHNIQUE: Multidetector CT imaging of the chest was performed using the standard protocol during bolus administration of intravenous contrast. Multiplanar CT image reconstructions and MIPs were obtained to evaluate the vascular anatomy. Multidetector CT imaging of the abdomen and pelvis was performed using the standard protocol during bolus administration of intravenous contrast. RADIATION DOSE REDUCTION: This exam was performed according to the departmental dose-optimization program which includes automated exposure control, adjustment of the mA and/or kV according to patient size and/or use of  iterative reconstruction technique. CONTRAST:  OMNIPAQUE IOHEXOL 350 MG/ML SOLN COMPARISON:  Portable chest yesterday is the only relevant prior study. FINDINGS: CTA CHEST FINDINGS Cardiovascular: Satisfactory opacification of the pulmonary arteries to the segmental level. No evidence of pulmonary embolism. Normal heart size. No pericardial effusion. Normal aorta and great vessels. Mediastinum/Nodes: No enlarged mediastinal, hilar, or axillary lymph nodes. Thyroid gland, trachea, and esophagus demonstrate no significant findings. Lungs/Pleura: There are minimal paraseptal and centrilobular emphysematous changes in the upper lung apices. There is mild generalized bronchial thickening. No consolidation, effusion or nodules are seen. Mild elevation right hemidiaphragm. Musculoskeletal: No chest wall abnormality. No acute or significant osseous findings. Review of the MIP images confirms the above findings. CT ABDOMEN and PELVIS FINDINGS Hepatobiliary: There is a cyst posteriorly in hepatic segment 5,  Hounsfield density is 21. Elsewhere there are occasional scattered subcentimeter hypodensities which too small to characterize. The liver is 19 cm length slightly steatotic, without discrete mass enhancement. Pancreas: No abnormality. Spleen: Normal in size. There are diffuse innumerable hypodense lesions within the spleen, some showing rim enhancement. Largest measures 2.1 cm medially. Differential diagnosis includes metastatic disease to the spleen, splenic lymphoma, infectious process, and granulomatous disease. Adrenals/Urinary Tract: Adrenal glands are unremarkable. Kidneys are normal, without renal calculi, focal lesion, or hydronephrosis. Bladder is unremarkable. Stomach/Bowel: No dilatation or wall thickening including the appendix. Vascular/Lymphatic: No significant vascular findings are present. No enlarged abdominal or pelvic lymph nodes. Reproductive: Limited fine detail through the pelvis due to motion artifacts. Uterus and bilateral adnexa are unremarkable. Multiple pelvic phleboliths. Other: Small umbilical fat hernia. No incarcerated hernia. No free fluid or free air. 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 No L5-S1 spondylolisthesis. No acute or other significant osseous findings. Review of the MIP images confirms the above findings. IMPRESSION: 1. No evidence of aortic dissection or pulmonary embolus. 2. Minimal biapical emphysema with mild bronchitis. 3. Diffuse innumerable hypodense lesions in the spleen, some showing rim enhancement. Differential diagnosis includes metastatic disease to the spleen, splenic lymphoma, infectious process, and granulomatous disease. Consider MRI without and with contrast as follow-up imaging. 4. Slightly enlarged steatotic liver with a cyst in segment 5 and occasional subcentimeter hypodensities which are too small to characterize. 5. Small umbilical fat hernia. 6. 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 No L5-S1  spondylolisthesis. Emphysema (ICD10-J43.9). Electronically Signed   By: Almira Bar M.D.   On: 09/30/2023 02:04   CT ABDOMEN PELVIS W CONTRAST Result Date: 09/30/2023 CLINICAL DATA:  Positive D-dimer with nausea, vomiting and abdominal pain. EXAM: CT ANGIOGRAPHY CHEST CT ABDOMEN AND PELVIS WITH CONTRAST TECHNIQUE: Multidetector CT imaging of the chest was performed using the standard protocol during bolus administration of intravenous contrast. Multiplanar CT image reconstructions and MIPs were obtained to evaluate the vascular anatomy. Multidetector CT imaging of the abdomen and pelvis was performed using the standard protocol during bolus administration of intravenous contrast. RADIATION DOSE REDUCTION: This exam was performed according to the departmental dose-optimization program which includes automated exposure control, adjustment of the mA and/or kV according to patient size and/or use of iterative reconstruction technique. CONTRAST:  OMNIPAQUE IOHEXOL 350 MG/ML SOLN COMPARISON:  Portable chest yesterday is the only relevant prior study. FINDINGS: CTA CHEST FINDINGS Cardiovascular: Satisfactory opacification of the pulmonary arteries to the segmental level. No evidence of pulmonary embolism. Normal heart size. No pericardial effusion. Normal aorta and great vessels. Mediastinum/Nodes: No enlarged mediastinal, hilar, or axillary lymph nodes. Thyroid gland, trachea, and esophagus demonstrate  no significant findings. Lungs/Pleura: There are minimal paraseptal and centrilobular emphysematous changes in the upper lung apices. There is mild generalized bronchial thickening. No consolidation, effusion or nodules are seen. Mild elevation right hemidiaphragm. Musculoskeletal: No chest wall abnormality. No acute or significant osseous findings. Review of the MIP images confirms the above findings. CT ABDOMEN and PELVIS FINDINGS Hepatobiliary: There is a cyst posteriorly in hepatic segment 5, Hounsfield  density is 21. Elsewhere there are occasional scattered subcentimeter hypodensities which too small to characterize. The liver is 19 cm length slightly steatotic, without discrete mass enhancement. Pancreas: No abnormality. Spleen: Normal in size. There are diffuse innumerable hypodense lesions within the spleen, some showing rim enhancement. Largest measures 2.1 cm medially. Differential diagnosis includes metastatic disease to the spleen, splenic lymphoma, infectious process, and granulomatous disease. Adrenals/Urinary Tract: Adrenal glands are unremarkable. Kidneys are normal, without renal calculi, focal lesion, or hydronephrosis. Bladder is unremarkable. Stomach/Bowel: No dilatation or wall thickening including the appendix. Vascular/Lymphatic: No significant vascular findings are present. No enlarged abdominal or pelvic lymph nodes. Reproductive: Limited fine detail through the pelvis due to motion artifacts. Uterus and bilateral adnexa are unremarkable. Multiple pelvic phleboliths. Other: Small umbilical fat hernia. No incarcerated hernia. No free fluid or free air. 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 No L5-S1 spondylolisthesis. No acute or other significant osseous findings. Review of the MIP images confirms the above findings. IMPRESSION: 1. No evidence of aortic dissection or pulmonary embolus. 2. Minimal biapical emphysema with mild bronchitis. 3. Diffuse innumerable hypodense lesions in the spleen, some showing rim enhancement. Differential diagnosis includes metastatic disease to the spleen, splenic lymphoma, infectious process, and granulomatous disease. Consider MRI without and with contrast as follow-up imaging. 4. Slightly enlarged steatotic liver with a cyst in segment 5 and occasional subcentimeter hypodensities which are too small to characterize. 5. Small umbilical fat hernia. 6. 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 No L5-S1  spondylolisthesis. Emphysema (ICD10-J43.9). Electronically Signed   By: Almira Bar M.D.   On: 09/30/2023 02:04   DG Chest Port 1 View Result Date: 09/29/2023 CLINICAL DATA:  vomiting EXAM: PORTABLE CHEST 1 VIEW COMPARISON:  None Available. FINDINGS: The cardiomediastinal contours are normal. The lungs are clear. Pulmonary vasculature is normal. No consolidation, pleural effusion, or pneumothorax. No acute osseous abnormalities are seen. IMPRESSION: No active disease. Electronically Signed   By: Narda Rutherford M.D.   On: 09/29/2023 23:39      Joycelyn Das, MD  [facility] 09/30/2023  If 7PM-7AM, please contact night-coverage

## 2023-09-30 NOTE — Progress Notes (Signed)
 Pharmacy Antibiotic Note  [name] is a 43 y.o. female admitted on 09/29/2023 with  concern for bacteremia/endocarditis .  Pharmacy has been consulted for Vancomycin dosing. WBC mildly elevated. Renal function ok. CT scan with innumerable splenic lesions. Recent dental abscess drained.   Plan: Vancomycin 1250 mg IV q24h >>>Estimated AUC: 467 Ceftriaxone per MD Trend WBC, temp, renal function  F/U infectious work-up Drug levels as indicated   Height: 5' 7.5" (171.5 cm) Weight: 59 kg (130 lb) IBW/kg (Calculated) : 62.75  Temp (24hrs), Avg:98.2 F (36.8 C), Min:98.2 F (36.8 C), Max:98.2 F (36.8 C)  Recent Labs  Lab 09/29/23 1921 09/29/23 2133  WBC 13.5*  --   CREATININE 1.01*  --   LATICACIDVEN  --  1.2    Estimated Creatinine Clearance: 67.6 mL/min (A) (by C-G formula based on SCr of 1.01 mg/dL (H)).    No Known Allergies  Abran Duke, PharmD, BCPS Clinical Pharmacist Phone: [phone]

## 2023-09-30 NOTE — Plan of Care (Signed)
  Problem: Nutrition: Goal: Adequate nutrition will be maintained Outcome: Progressing   Problem: Clinical Measurements: Goal: Ability to maintain clinical measurements within normal limits will improve Outcome: Progressing   Problem: Clinical Measurements: Goal: Will remain free from infection Outcome: Progressing   Problem: Clinical Measurements: Goal: Respiratory complications will improve Outcome: Progressing   Problem: Clinical Measurements: Goal: Cardiovascular complication will be avoided Outcome: Progressing   Problem: Pain Managment: Goal: General experience of comfort will improve and/or be controlled Outcome: Progressing   Problem: Safety: Goal: Ability to remain free from injury will improve Outcome: Progressing

## 2023-09-30 NOTE — ED Provider Notes (Signed)
  Physical Exam  BP (!) 148/87   Pulse (!) 106   Temp 98.2 F (36.8 C)   Resp 16   Ht 5' 7.5" (1.715 m)   Wt 59 kg   SpO2 98%   BMI 20.06 kg/m     Procedures  Procedures  ED Course / MDM    Medical Decision Making Amount and/or Complexity of Data Reviewed Labs: ordered. Radiology: ordered.  Risk Prescription drug management. Decision regarding hospitalization.   53F presenting with nausea and vomiting. Pt recently traveled to [facility], was seen for altitude sickness. On her menstrual cycle. Had a dental abscess drained here. Dimer elevated, now complaining of abdominal pain.   CTA PE and Abd Pending: IMPRESSION:  1. No evidence of aortic dissection or pulmonary embolus.  2. Minimal biapical emphysema with mild bronchitis.  3. Diffuse innumerable hypodense lesions in the spleen, some showing  rim enhancement. Differential diagnosis includes metastatic disease  to the spleen, splenic lymphoma, infectious process, and  granulomatous disease. Consider MRI without and with contrast as  follow-up imaging.  4. Slightly enlarged steatotic liver with a cyst in segment 5 and  occasional subcentimeter hypodensities which are too small to  characterize.  5. Small umbilical fat hernia.  6. 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 No L5-S1  spondylolisthesis.    Emphysema (ICD10-J43.9).    The patient was informed of the results of her CT imaging.  She was overall well-appearing on repeat assessment and tolerating oral intake.  On exam, the patient has a cardiac murmur which she states is new.  In the setting of recent odontogenic infection, new rim-enhancing lesions throughout the spleen, new cardiac murmur, I considered endocarditis.  Discussed with the patient admission for further workup in the setting of this and the patient was in agreement.  Hospitalist medicine consulted for admission.  Blood cultures were ordered in addition to IV vancomycin. Pt does meet  SIRS criteria with tachycardia and leukocytosis without clear source at this time, other then possible infectious process involving the spleen and possible endocarditis. I discussed with Dr. Haroldine Laws who accepted the patient in admission.     Ernie Avena, MD 09/30/23 [phone]

## 2023-09-30 NOTE — ED Notes (Signed)
 Pt was given light blue gatorade with ice chips

## 2023-09-30 NOTE — H&P (Signed)
 History and Physical    [name] AVW:[id number] DOB: January 06, 1981 DOA: 09/29/2023  PCP: Patient, No Pcp Per   Patient coming from: Home   Chief Complaint: Elevated BP, N/V, fatigue  HPI: [name] is a 43 y.o. female with medical history significant for depression and ADHD who presents for evaluation of elevated blood pressure, nausea, and vomiting.  Patient reports that she was in her usual state of health the night of 09/23/2023 when she had 1.5 alcoholic beverages and smoked some marijuana.  The following morning, she had severe nausea with recurrent vomiting.  She called EMS at that time and was treated with IV fluids and antiemetics.  She improved initially, but worsened again later in the day and also noted swelling and redness involving her right face; she was diagnosed with dental abscess and was started on Augmentin.  She was noted to have severely elevated blood pressure during her evaluation.    She has continued to experience nausea with nonbloody vomiting, fatigue, and elevated BP that she has been monitoring at home.  She denies any headache or chest pain associated with this.  She denies any abdominal pain during this illness.  She has not noticed any rash.  [facility] ED Course: Upon arrival to the ED, patient is found to be afebrile and saturating well on room air with mild tachycardia and elevated blood pressure.  Labs are most notable for potassium 3.4, creatinine 1.01, WBC 13,500, lactic acid 1.2, D-dimer 2.03, and negative respiratory virus panel.  CTA chest is negative for PE.  CT of the abdomen pelvis reveals diffuse innumerable hypodense splenic lesions, some with rim enhancement.  Blood cultures were collected, patient was treated with 2 L of LR, Zofran, vancomycin, and Rocephin, and she was transferred to [facility] for admission.  Review of Systems:  All other systems reviewed and apart from HPI, are negative.  Past Medical History:   Diagnosis Date   ADHD (attention deficit hyperactivity disorder)    Anemia    Depression    Headache     Past Surgical History:  Procedure Laterality Date   NO PAST SURGERIES      Social History:   reports that she quit smoking about 9 years ago. She has never used smokeless tobacco. She reports that she does not currently use drugs after having used the following drugs: Marijuana. She reports that she does not drink alcohol.  No Known Allergies  Family History  Problem Relation Age of Onset   Asthma Sister    Depression Sister    Asthma Maternal Grandmother    Drug abuse Other    Alcohol abuse Neg Hx    Arthritis Neg Hx    Birth defects Neg Hx    Cancer Neg Hx    COPD Neg Hx    Diabetes Neg Hx    Hypertension Neg Hx    Hyperlipidemia Neg Hx    Hearing loss Neg Hx    Early death Neg Hx    Heart disease Neg Hx    Kidney disease Neg Hx    Mental illness Neg Hx    Mental retardation Neg Hx    Learning disabilities Neg Hx    Miscarriages / Stillbirths Neg Hx    Stroke Neg Hx    Vision loss Neg Hx    Varicose Veins Neg Hx      Prior to Admission medications   Medication Sig Start Date End Date Taking? Authorizing Provider  acetaminophen (PAIN RELIEVER)  325 MG tablet Take 650 mg by mouth every 6 (six) hours as needed.    [provider]  ibuprofen (ADVIL,MOTRIN) 600 MG tablet Take 1 tablet (600 mg total) by mouth every 6 (six) hours. 07/18/16   Clearance Coots, MD  naproxen (NAPROSYN) 500 MG tablet Take 1 tablet (500 mg total) by mouth 2 (two) times daily. 04/14/18   Law, Waylan Boga, PA-C  Prenatal Vit-Fe Fum-Fe Bisg-FA (NATACHEW) 28-1 MG CHEW Chew 1 tablet by mouth daily. 01/08/14   Katrinka Blazing, [place], CNM  senna-docusate (SENOKOT-S) 8.6-50 MG tablet Take 2 tablets by mouth daily. 07/19/16   Clearance Coots, MD    Physical Exam: Vitals:   09/30/23 0354 09/30/23 0355 09/30/23 0456 09/30/23 0503  BP:    (!) 163/102  Pulse: 96 94  (!) 107  Resp: 13 13  18   Temp:     97.7 F (36.5 C)  TempSrc:    Oral  SpO2: 99% 99%  100%  Weight:   64.2 kg   Height:   5\' 7"  (1.702 m)     Constitutional: NAD, no pallor or diaphoresis   Eyes: PERTLA, lids and conjunctivae normal ENMT: Mucous membranes are moist. Posterior pharynx clear of any exudate or lesions.   Neck: supple, no masses  Respiratory: no wheezing, no crackles. No accessory muscle use.  Cardiovascular: S1 & S2 heard, regular rate and rhythm. No extremity edema.  Abdomen: No distension, soft, no guarding. Bowel sounds active.  Musculoskeletal: no clubbing / cyanosis. No joint deformity upper and lower extremities.   Skin: no significant rashes, lesions, ulcers. Warm, dry, well-perfused. Neurologic: CN 2-12 grossly intact. Moving all extremities. Alert and oriented.    Labs and Imaging on Admission: I have personally reviewed following labs and imaging studies  CBC: Recent Labs  Lab 09/29/23 1921  WBC 13.5*  HGB 16.0*  HCT 43.1  MCV 84.5  PLT 363   Basic Metabolic Panel: Recent Labs  Lab 09/29/23 1921  NA 135  K 3.4*  CL 103  CO2 21*  GLUCOSE 117*  BUN 25*  CREATININE 1.01*  CALCIUM 9.8   GFR: Estimated Creatinine Clearance: 70.6 mL/min (A) (by C-G formula based on SCr of 1.01 mg/dL (H)). Liver Function Tests: Recent Labs  Lab 09/29/23 1921  AST 13*  ALT 17  ALKPHOS 58  BILITOT 0.6  PROT 8.6*  ALBUMIN 4.6   Recent Labs  Lab 09/29/23 1921  LIPASE 25   No results for input(s): "AMMONIA" in the last 168 hours. Coagulation Profile: No results for input(s): "INR", "PROTIME" in the last 168 hours. Cardiac Enzymes: No results for input(s): "CKTOTAL", "CKMB", "CKMBINDEX", "TROPONINI" in the last 168 hours. BNP (last 3 results) No results for input(s): "PROBNP" in the last 8760 hours. HbA1C: No results for input(s): "HGBA1C" in the last 72 hours. CBG: No results for input(s): "GLUCAP" in the last 168 hours. Lipid Profile: No results for input(s): "CHOL", "HDL",  "LDLCALC", "TRIG", "CHOLHDL", "LDLDIRECT" in the last 72 hours. Thyroid Function Tests: No results for input(s): "TSH", "T4TOTAL", "FREET4", "T3FREE", "THYROIDAB" in the last 72 hours. Anemia Panel: No results for input(s): "VITAMINB12", "FOLATE", "FERRITIN", "TIBC", "IRON", "RETICCTPCT" in the last 72 hours. Urine analysis:    Component Value Date/Time   COLORURINE YELLOW 09/29/2023 2133   APPEARANCEUR CLEAR 09/29/2023 2133   LABSPEC 1.032 (H) 09/29/2023 2133   PHURINE 6.0 09/29/2023 2133   GLUCOSEU NEGATIVE 09/29/2023 2133   HGBUR LARGE (A) 09/29/2023 2133   BILIRUBINUR NEGATIVE 09/29/2023 2133  KETONESUR 40 (A) 09/29/2023 2133   PROTEINUR >300 (A) 09/29/2023 2133   UROBILINOGEN 1.0 01/08/2014 1957   NITRITE NEGATIVE 09/29/2023 2133   LEUKOCYTESUR NEGATIVE 09/29/2023 2133   Sepsis Labs: @LABRCNTIP (procalcitonin:4,lacticidven:4) ) Recent Results (from the past 240 hours)  Resp panel by RT-PCR (RSV, Flu A&B, Covid) Urine, Clean Catch     Status: None   Collection Time: 09/29/23  9:33 PM   Specimen: Urine, Clean Catch; Nasal Swab  Result Value Ref Range Status   SARS Coronavirus 2 by RT PCR NEGATIVE NEGATIVE Final    Comment: (NOTE) SARS-CoV-2 target nucleic acids are NOT DETECTED.  The SARS-CoV-2 RNA is generally detectable in upper respiratory specimens during the acute phase of infection. The lowest concentration of SARS-CoV-2 viral copies this assay can detect is 138 copies/mL. A negative result does not preclude SARS-Cov-2 infection and should not be used as the sole basis for treatment or other patient management decisions. A negative result may occur with  improper specimen collection/handling, submission of specimen other than nasopharyngeal swab, presence of viral mutation(s) within the areas targeted by this assay, and inadequate number of viral copies(<138 copies/mL). A negative result must be combined with clinical observations, patient history, and  epidemiological information. The expected result is Negative.  Fact Sheet for Patients:  BloggerCourse.com  Fact Sheet for Healthcare Providers:  SeriousBroker.it  This test is no t yet approved or cleared by the [place] FDA and  has been authorized for detection and/or diagnosis of SARS-CoV-2 by FDA under an Emergency Use Authorization (EUA). This EUA will remain  in effect (meaning this test can be used) for the duration of the COVID-19 declaration under Section 564(b)(1) of the Act, 21 U.S.C.section 360bbb-3(b)(1), unless the authorization is terminated  or revoked sooner.       Influenza A by PCR NEGATIVE NEGATIVE Final   Influenza B by PCR NEGATIVE NEGATIVE Final    Comment: (NOTE) The Xpert Xpress SARS-CoV-2/FLU/RSV plus assay is intended as an aid in the diagnosis of influenza from Nasopharyngeal swab specimens and should not be used as a sole basis for treatment. Nasal washings and aspirates are unacceptable for Xpert Xpress SARS-CoV-2/FLU/RSV testing.  Fact Sheet for Patients: BloggerCourse.com  Fact Sheet for Healthcare Providers: SeriousBroker.it  This test is not yet approved or cleared by the [place] FDA and has been authorized for detection and/or diagnosis of SARS-CoV-2 by FDA under an Emergency Use Authorization (EUA). This EUA will remain in effect (meaning this test can be used) for the duration of the COVID-19 declaration under Section 564(b)(1) of the Act, 21 U.S.C. section 360bbb-3(b)(1), unless the authorization is terminated or revoked.     Resp Syncytial Virus by PCR NEGATIVE NEGATIVE Final    Comment: (NOTE) Fact Sheet for Patients: BloggerCourse.com  Fact Sheet for Healthcare Providers: SeriousBroker.it  This test is not yet approved or cleared by the [place] FDA and has been  authorized for detection and/or diagnosis of SARS-CoV-2 by FDA under an Emergency Use Authorization (EUA). This EUA will remain in effect (meaning this test can be used) for the duration of the COVID-19 declaration under Section 564(b)(1) of the Act, 21 U.S.C. section 360bbb-3(b)(1), unless the authorization is terminated or revoked.  Performed at [facility], [address], [place], [place] [address]      Radiological Exams on Admission: CT Angio Chest PE W/Cm &/Or Wo Cm Result Date: 09/30/2023 CLINICAL DATA:  Positive D-dimer with nausea, vomiting and abdominal pain. EXAM: CT ANGIOGRAPHY CHEST CT  ABDOMEN AND PELVIS WITH CONTRAST TECHNIQUE: Multidetector CT imaging of the chest was performed using the standard protocol during bolus administration of intravenous contrast. Multiplanar CT image reconstructions and MIPs were obtained to evaluate the vascular anatomy. Multidetector CT imaging of the abdomen and pelvis was performed using the standard protocol during bolus administration of intravenous contrast. RADIATION DOSE REDUCTION: This exam was performed according to the departmental dose-optimization program which includes automated exposure control, adjustment of the mA and/or kV according to patient size and/or use of iterative reconstruction technique. CONTRAST:  OMNIPAQUE IOHEXOL 350 MG/ML SOLN COMPARISON:  Portable chest yesterday is the only relevant prior study. FINDINGS: CTA CHEST FINDINGS Cardiovascular: Satisfactory opacification of the pulmonary arteries to the segmental level. No evidence of pulmonary embolism. Normal heart size. No pericardial effusion. Normal aorta and great vessels. Mediastinum/Nodes: No enlarged mediastinal, hilar, or axillary lymph nodes. Thyroid gland, trachea, and esophagus demonstrate no significant findings. Lungs/Pleura: There are minimal paraseptal and centrilobular emphysematous changes in the upper lung apices. There is mild  generalized bronchial thickening. No consolidation, effusion or nodules are seen. Mild elevation right hemidiaphragm. Musculoskeletal: No chest wall abnormality. No acute or significant osseous findings. Review of the MIP images confirms the above findings. CT ABDOMEN and PELVIS FINDINGS Hepatobiliary: There is a cyst posteriorly in hepatic segment 5, Hounsfield density is 21. Elsewhere there are occasional scattered subcentimeter hypodensities which too small to characterize. The liver is 19 cm length slightly steatotic, without discrete mass enhancement. Pancreas: No abnormality. Spleen: Normal in size. There are diffuse innumerable hypodense lesions within the spleen, some showing rim enhancement. Largest measures 2.1 cm medially. Differential diagnosis includes metastatic disease to the spleen, splenic lymphoma, infectious process, and granulomatous disease. Adrenals/Urinary Tract: Adrenal glands are unremarkable. Kidneys are normal, without renal calculi, focal lesion, or hydronephrosis. Bladder is unremarkable. Stomach/Bowel: No dilatation or wall thickening including the appendix. Vascular/Lymphatic: No significant vascular findings are present. No enlarged abdominal or pelvic lymph nodes. Reproductive: Limited fine detail through the pelvis due to motion artifacts. Uterus and bilateral adnexa are unremarkable. Multiple pelvic phleboliths. Other: Small umbilical fat hernia. No incarcerated hernia. No free fluid or free air. 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 No L5-S1 spondylolisthesis. No acute or other significant osseous findings. Review of the MIP images confirms the above findings. IMPRESSION: 1. No evidence of aortic dissection or pulmonary embolus. 2. Minimal biapical emphysema with mild bronchitis. 3. Diffuse innumerable hypodense lesions in the spleen, some showing rim enhancement. Differential diagnosis includes metastatic disease to the spleen, splenic  lymphoma, infectious process, and granulomatous disease. Consider MRI without and with contrast as follow-up imaging. 4. Slightly enlarged steatotic liver with a cyst in segment 5 and occasional subcentimeter hypodensities which are too small to characterize. 5. Small umbilical fat hernia. 6. 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 No L5-S1 spondylolisthesis. Emphysema (ICD10-J43.9). Electronically Signed   By: Almira Bar M.D.   On: 09/30/2023 02:04   CT ABDOMEN PELVIS W CONTRAST Result Date: 09/30/2023 CLINICAL DATA:  Positive D-dimer with nausea, vomiting and abdominal pain. EXAM: CT ANGIOGRAPHY CHEST CT ABDOMEN AND PELVIS WITH CONTRAST TECHNIQUE: Multidetector CT imaging of the chest was performed using the standard protocol during bolus administration of intravenous contrast. Multiplanar CT image reconstructions and MIPs were obtained to evaluate the vascular anatomy. Multidetector CT imaging of the abdomen and pelvis was performed using the standard protocol during bolus administration of intravenous contrast. RADIATION DOSE REDUCTION: This exam was performed according to the departmental  dose-optimization program which includes automated exposure control, adjustment of the mA and/or kV according to patient size and/or use of iterative reconstruction technique. CONTRAST:  OMNIPAQUE IOHEXOL 350 MG/ML SOLN COMPARISON:  Portable chest yesterday is the only relevant prior study. FINDINGS: CTA CHEST FINDINGS Cardiovascular: Satisfactory opacification of the pulmonary arteries to the segmental level. No evidence of pulmonary embolism. Normal heart size. No pericardial effusion. Normal aorta and great vessels. Mediastinum/Nodes: No enlarged mediastinal, hilar, or axillary lymph nodes. Thyroid gland, trachea, and esophagus demonstrate no significant findings. Lungs/Pleura: There are minimal paraseptal and centrilobular emphysematous changes in the upper lung apices. There is mild  generalized bronchial thickening. No consolidation, effusion or nodules are seen. Mild elevation right hemidiaphragm. Musculoskeletal: No chest wall abnormality. No acute or significant osseous findings. Review of the MIP images confirms the above findings. CT ABDOMEN and PELVIS FINDINGS Hepatobiliary: There is a cyst posteriorly in hepatic segment 5, Hounsfield density is 21. Elsewhere there are occasional scattered subcentimeter hypodensities which too small to characterize. The liver is 19 cm length slightly steatotic, without discrete mass enhancement. Pancreas: No abnormality. Spleen: Normal in size. There are diffuse innumerable hypodense lesions within the spleen, some showing rim enhancement. Largest measures 2.1 cm medially. Differential diagnosis includes metastatic disease to the spleen, splenic lymphoma, infectious process, and granulomatous disease. Adrenals/Urinary Tract: Adrenal glands are unremarkable. Kidneys are normal, without renal calculi, focal lesion, or hydronephrosis. Bladder is unremarkable. Stomach/Bowel: No dilatation or wall thickening including the appendix. Vascular/Lymphatic: No significant vascular findings are present. No enlarged abdominal or pelvic lymph nodes. Reproductive: Limited fine detail through the pelvis due to motion artifacts. Uterus and bilateral adnexa are unremarkable. Multiple pelvic phleboliths. Other: Small umbilical fat hernia. No incarcerated hernia. No free fluid or free air. 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 No L5-S1 spondylolisthesis. No acute or other significant osseous findings. Review of the MIP images confirms the above findings. IMPRESSION: 1. No evidence of aortic dissection or pulmonary embolus. 2. Minimal biapical emphysema with mild bronchitis. 3. Diffuse innumerable hypodense lesions in the spleen, some showing rim enhancement. Differential diagnosis includes metastatic disease to the spleen, splenic  lymphoma, infectious process, and granulomatous disease. Consider MRI without and with contrast as follow-up imaging. 4. Slightly enlarged steatotic liver with a cyst in segment 5 and occasional subcentimeter hypodensities which are too small to characterize. 5. Small umbilical fat hernia. 6. 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 No L5-S1 spondylolisthesis. Emphysema (ICD10-J43.9). Electronically Signed   By: Almira Bar M.D.   On: 09/30/2023 02:04   DG Chest Port 1 View Result Date: 09/29/2023 CLINICAL DATA:  vomiting EXAM: PORTABLE CHEST 1 VIEW COMPARISON:  None Available. FINDINGS: The cardiomediastinal contours are normal. The lungs are clear. Pulmonary vasculature is normal. No consolidation, pleural effusion, or pneumothorax. No acute osseous abnormalities are seen. IMPRESSION: No active disease. Electronically Signed   By: Narda Rutherford M.D.   On: 09/29/2023 23:39    Assessment/Plan  1. Splenic lesions  - Multiple SIRS criteria present, Ddx includes infectious process, granulomatous disease, metastatic disease, and splenic lymphoma - Continue empiric antibiotics, check MRI and echocardiogram, follow cultures and clinical course   2. Hypertensive urgency  - No target-organ damage  - Not on any antihypertensives at home, will treat as-needed for now    3. Hypokalemia  - Replacing   4. Dental abscess  - S/p I&D in ED, does not appear to have any significant surrounding cellulitis    DVT prophylaxis: Lovenox  Code Status: Full  Level of Care: Level of care: Telemetry Medical Family Communication: None present  Disposition Plan:  Patient is from: home  Anticipated d/c is to: Home  Anticipated d/c date is: 10/03/23  Patient currently: Pending cultures, MRI abdomen, TTE  Consults called: None  Admission status: Inpatient     Briscoe Deutscher, MD [facility]  09/30/2023, 5:21 AM

## 2023-10-01 DIAGNOSIS — D7389 Other diseases of spleen: Secondary | ICD-10-CM | POA: Diagnosis not present

## 2023-10-01 LAB — COMPREHENSIVE METABOLIC PANEL
ALT: 23 U/L (ref 0–44)
AST: 23 U/L (ref 15–41)
Albumin: 3.3 g/dL — ABNORMAL LOW (ref 3.5–5.0)
Alkaline Phosphatase: 46 U/L (ref 38–126)
Anion gap: 9 (ref 5–15)
BUN: 12 mg/dL (ref 6–20)
CO2: 21 mmol/L — ABNORMAL LOW (ref 22–32)
Calcium: 9 mg/dL (ref 8.9–10.3)
Chloride: 108 mmol/L (ref 98–111)
Creatinine, Ser: 0.88 mg/dL (ref 0.44–1.00)
GFR, Estimated: >60 mL/min (ref >60)
Glucose, Bld: 110 mg/dL — ABNORMAL HIGH (ref 70–99)
Potassium: 3.5 mmol/L (ref 3.5–5.1)
Sodium: 138 mmol/L (ref 135–145)
Total Bilirubin: 0.5 mg/dL (ref 0.0–1.2)
Total Protein: 7 g/dL (ref 6.5–8.1)

## 2023-10-01 LAB — CBC
HCT: 37.3 % (ref 36.0–46.0)
Hemoglobin: 13.5 g/dL (ref 12.0–15.0)
MCH: 30.8 pg (ref 26.0–34.0)
MCHC: 36.2 g/dL — ABNORMAL HIGH (ref 30.0–36.0)
MCV: 85 fL (ref 80.0–100.0)
Platelets: 438 10*3/uL — ABNORMAL HIGH (ref 150–400)
RBC: 4.39 MIL/uL (ref 3.87–5.11)
RDW: 12.6 % (ref 11.5–15.5)
WBC: 13.3 10*3/uL — ABNORMAL HIGH (ref 4.0–10.5)
nRBC: 0 % (ref 0.0–0.2)

## 2023-10-01 LAB — CEA: CEA: 1 ng/mL (ref 0.0–4.7)

## 2023-10-01 LAB — AFP TUMOR MARKER: AFP, Serum, Tumor Marker: <1.8 ng/mL (ref 0.0–6.4)

## 2023-10-01 LAB — MAGNESIUM: Magnesium: 1.9 mg/dL (ref 1.7–2.4)

## 2023-10-01 MED ORDER — METOPROLOL TARTRATE 25 MG PO TABS
25.0000 mg | ORAL_TABLET | Freq: Two times a day (BID) | ORAL | Status: DC
  Administered 2023-10-01 – 2023-10-04 (×8): 25 mg via ORAL
  Filled 2023-10-01 (×8): qty 1

## 2023-10-01 MED ORDER — ORAL CARE MOUTH RINSE: 15.0000 mL | OROMUCOSAL | Status: DC | PRN

## 2023-10-01 MED ORDER — PANTOPRAZOLE SODIUM 40 MG PO TBEC
40.0000 mg | DELAYED_RELEASE_TABLET | Freq: Every day | ORAL | Status: DC
  Administered 2023-10-01 – 2023-10-05 (×5): 40 mg via ORAL
  Filled 2023-10-01 (×5): qty 1

## 2023-10-01 MED ORDER — ALUM & MAG HYDROXIDE-SIMETH 200-200-20 MG/5ML PO SUSP
30.0000 mL | Freq: Four times a day (QID) | ORAL | Status: DC | PRN
  Administered 2023-10-01: 30 mL via ORAL
  Filled 2023-10-01: qty 30

## 2023-10-01 NOTE — Plan of Care (Signed)
   Problem: Health Behavior/Discharge Planning: Goal: Ability to manage health-related needs will improve Outcome: Completed/Met

## 2023-10-01 NOTE — Progress Notes (Addendum)
 PROGRESS NOTE  [name] ZOX:[id number] DOB: 02-06-1981 DOA: 09/29/2023 PCP: Patient, No Pcp Per   LOS: 1 day   Brief narrative:  [name] is a 43 y.o. female with medical history significant for depression and ADHD presented to hospital with elevated blood pressure nausea and vomiting.  Patient stated that she was okay until 09/23/2023 when she had alcoholic beverages and marijuana subsequently started having nausea and recurrent vomiting at that time EMS was called in and received IV fluids and antiemetics with some improvement but subsequently got worse and then continued to experience nausea and vomiting and fatigue.  In the ED patient was tachycardic with elevated blood pressure.  Labs showed hypokalemia with potassium of 3.4 and WBC was elevated at 13.5 K.  Respiratory viral panel was negative.  CTA of the chest was negative for PE.  CT scan of the abdomen pelvis showed diffuse  hypodense splenic lesions, some with rim enhancement.  Urinalysis showed protein with 11-20 white cells.  No nitrites.  Lactate was 1.2.  D-dimer was elevated more than 2 so CT angiogram of the chest was performed which showed no evidence of pulmonary embolism but apical emphysema with mild bronchitis and hypodense lesions in the spleen some swelling remained has been.  Pregnancy test was negative.  Lipase was 25.  Blood cultures were collected and patient was given 2 L of Ringer lactate solution vancomycin and Rocephin and was out of the hospital for further evaluation and treatment.     Assessment/Plan: Principal Problem:   Splenic lesion Active Problems:   Hypertensive urgency   Hypokalemia   Dental abscess  Splenic lesions. Uncertain etiology.  Multiple hypodense lesions with rim enhancement differentials including splenic lymphoma infectious process could be embolic lesions from infective endocarditis.  Patient noted to have murmur but patient saying that she had it from childhood.  Continue  empiric antibiotic with vancomycin and Rocephin..  Follow blood cultures.  Urine drug screen positive for benzodiazepines and THC.  D-dimer was elevated.  CT angiogram of the chest however negative for PE.  HIV nonreactive.  Blood cultures negative less than 24 hours.  2D echocardiogram shows abnormal septal wall motion but LV ejection fraction of 60 to 65% with grade 1 diastolic dysfunction.  Tribal mitral valve regurgitation and aortic valve regurgitation. Mri of the abdomen with hypodense lesions possibility infective in etiology.  Nausea vomiting.  Resolved   Hypertensive urgency  Not on antihypertensives at home.  Likely new onset hypertension.  Will start the patient on beta-blocker since she is slightly tachycardic as well.   Hypokalemia  Replenish.  Check potassium levels in AM.  Magnesium of 1.8.  Mild leukocytosis.  Initial WBC at 13.5.  WBC of 14.8 yesterday.  Will repeat CBC in AM.   Dental abscess  Stable for I&D.  Improving.  Continue with antibiotic.  Patient stated that she had been having problems for the last 2 years and a that she was told to have root canal treatment but still has not had a chance.  Heartburn indigestion.  Will put the patient on Protonix and maalox.  Cannabis use.  Counseled about it.  No nausea vomiting.  DVT prophylaxis: enoxaparin (LOVENOX) injection 40 mg Start: 09/30/23 1000   Disposition: Likely home in 1 to 2 days  Status is: Inpatient  Remains inpatient appropriate because: Pending clinical improvement, further workup, IV antibiotic,    Code Status:     Code Status: Full Code  Family Communication: None at bedside, spoke with  the patient's mother on the phone and updated her about the clinical condition of the patient.  Consultants: None  Procedures: None yet  Anti-infectives:  Rocephin and vancomycin IV.  Anti-infectives (From admission, onward)    Start     Dose/Rate Route Frequency Ordered Stop   09/30/23 2200   cefTRIAXone (ROCEPHIN) 2 g in sodium chloride 0.9 % 100 mL IVPB        2 g 200 mL/hr over 30 Minutes Intravenous Every 24 hours 09/30/23 0326     09/30/23 2200  vancomycin (VANCOREADY) IVPB 1250 mg/250 mL        1,250 mg 166.7 mL/hr over 90 Minutes Intravenous Every 24 hours 09/30/23 0358     09/30/23 0300  vancomycin (VANCOCIN) IVPB 1000 mg/200 mL premix        1,000 mg 200 mL/hr over 60 Minutes Intravenous  Once 09/30/23 0246 09/30/23 0530   09/29/23 2200  cefTRIAXone (ROCEPHIN) 2 g in sodium chloride 0.9 % 100 mL IVPB        2 g 200 mL/hr over 30 Minutes Intravenous  Once 09/29/23 2153 09/29/23 2237       Subjective: Today, patient was seen and examined at bedside.  Patient complains of heartburn and indigestion.  Denies any nausea vomiting fever chills or shortness of breath.  Objective: Vitals:   10/01/23 0459 10/01/23 0934  BP: (!) 154/113 (!) 151/117  Pulse: 89 95  Resp: 18 18  Temp: 98.7 F (37.1 C) 97.9 F (36.6 C)  SpO2: 100% 100%    Intake/Output Summary (Last 24 hours) at 10/01/2023 1158 Last data filed at 10/01/2023 0900 Gross per 24 hour  Intake 1310.09 ml  Output 0 ml  Net 1310.09 ml   Filed Weights   09/29/23 1841 09/30/23 0456  Weight: 59 kg 64.2 kg   Body mass index is 22.16 kg/m.   Physical Exam:  GENERAL: Patient is alert awake and oriented. Not in obvious distress. HENT: No scleral pallor or icterus. Pupils equally reactive to light. Oral mucosa is moist, dental area with mild swelling. NECK: is supple, no gross swelling noted. CHEST: Clear to auscultation. No crackles or wheezes.  CVS: S1 and S2 heard, no murmur. Regular rate and rhythm.  ABDOMEN: Soft, non-tender, bowel sounds are present. EXTREMITIES: No edema. CNS: Cranial nerves are intact. No focal motor deficits. SKIN: warm and dry without rashes.  Data Review: I have personally reviewed the following laboratory data and studies,  CBC: Recent Labs  Lab 09/29/23 1921 09/30/23 0759   WBC 13.5* 14.8*  HGB 16.0* 13.7  HCT 43.1 36.7  MCV 84.5 83.2  PLT 363 298   Basic Metabolic Panel: Recent Labs  Lab 09/29/23 1921 09/30/23 0759  NA 135 136  K 3.4* 3.4*  CL 103 105  CO2 21* 20*  GLUCOSE 117* 103*  BUN 25* 15  CREATININE 1.01* 0.98  CALCIUM 9.8 9.0  MG  --  1.8   Liver Function Tests: Recent Labs  Lab 09/29/23 1921  AST 13*  ALT 17  ALKPHOS 58  BILITOT 0.6  PROT 8.6*  ALBUMIN 4.6   Recent Labs  Lab 09/29/23 1921  LIPASE 25   No results for input(s): "AMMONIA" in the last 168 hours. Cardiac Enzymes: No results for input(s): "CKTOTAL", "CKMB", "CKMBINDEX", "TROPONINI" in the last 168 hours. BNP (last 3 results) No results for input(s): "BNP" in the last 8760 hours.  ProBNP (last 3 results) No results for input(s): "PROBNP" in the last 8760 hours.  CBG: No results for input(s): "GLUCAP" in the last 168 hours. Recent Results (from the past 240 hours)  Blood culture (routine x 2)     Status: None (Preliminary result)   Collection Time: 09/29/23  9:32 PM   Specimen: BLOOD  Result Value Ref Range Status   Specimen Description   Final    BLOOD UNKNOWN Performed at [facility], [address] [place], [place] [address]    Special Requests   Final    BOTTLES DRAWN AEROBIC AND ANAEROBIC Blood Culture adequate volume Performed at [facility], [address], [place], [place] [address]    Culture   Final    NO GROWTH < 24 HOURS Performed at [facility], [address] [place], [place] [address]    Report Status PENDING  Incomplete  Resp panel by RT-PCR (RSV, Flu A&B, Covid) Urine, Clean Catch     Status: None   Collection Time: 09/29/23  9:33 PM   Specimen: Urine, Clean Catch; Nasal Swab  Result Value Ref Range Status   SARS Coronavirus 2 by RT PCR NEGATIVE NEGATIVE Final    Comment: (NOTE) SARS-CoV-2 target nucleic acids are NOT DETECTED.  The SARS-CoV-2 RNA is generally detectable in  upper respiratory specimens during the acute phase of infection. The lowest concentration of SARS-CoV-2 viral copies this assay can detect is 138 copies/mL. A negative result does not preclude SARS-Cov-2 infection and should not be used as the sole basis for treatment or other patient management decisions. A negative result may occur with  improper specimen collection/handling, submission of specimen other than nasopharyngeal swab, presence of viral mutation(s) within the areas targeted by this assay, and inadequate number of viral copies(<138 copies/mL). A negative result must be combined with clinical observations, patient history, and epidemiological information. The expected result is Negative.  Fact Sheet for Patients:  BloggerCourse.com  Fact Sheet for Healthcare Providers:  SeriousBroker.it  This test is no t yet approved or cleared by the [place] FDA and  has been authorized for detection and/or diagnosis of SARS-CoV-2 by FDA under an Emergency Use Authorization (EUA). This EUA will remain  in effect (meaning this test can be used) for the duration of the COVID-19 declaration under Section 564(b)(1) of the Act, 21 U.S.C.section 360bbb-3(b)(1), unless the authorization is terminated  or revoked sooner.       Influenza A by PCR NEGATIVE NEGATIVE Final   Influenza B by PCR NEGATIVE NEGATIVE Final    Comment: (NOTE) The Xpert Xpress SARS-CoV-2/FLU/RSV plus assay is intended as an aid in the diagnosis of influenza from Nasopharyngeal swab specimens and should not be used as a sole basis for treatment. Nasal washings and aspirates are unacceptable for Xpert Xpress SARS-CoV-2/FLU/RSV testing.  Fact Sheet for Patients: BloggerCourse.com  Fact Sheet for Healthcare Providers: SeriousBroker.it  This test is not yet approved or cleared by the [place] FDA and has been  authorized for detection and/or diagnosis of SARS-CoV-2 by FDA under an Emergency Use Authorization (EUA). This EUA will remain in effect (meaning this test can be used) for the duration of the COVID-19 declaration under Section 564(b)(1) of the Act, 21 U.S.C. section 360bbb-3(b)(1), unless the authorization is terminated or revoked.     Resp Syncytial Virus by PCR NEGATIVE NEGATIVE Final    Comment: (NOTE) Fact Sheet for Patients: BloggerCourse.com  Fact Sheet for Healthcare Providers: SeriousBroker.it  This test is not yet approved or cleared by the [place] and has been authorized  for detection and/or diagnosis of SARS-CoV-2 by FDA under an Emergency Use Authorization (EUA). This EUA will remain in effect (meaning this test can be used) for the duration of the COVID-19 declaration under Section 564(b)(1) of the Act, 21 U.S.C. section 360bbb-3(b)(1), unless the authorization is terminated or revoked.  Performed at [facility], [address] [place], [place] [address]   Culture, blood (routine x 2)     Status: None (Preliminary result)   Collection Time: 09/29/23  9:50 PM   Specimen: BLOOD RIGHT ARM  Result Value Ref Range Status   Specimen Description BLOOD RIGHT ARM  Final   Special Requests   Final    BOTTLES DRAWN AEROBIC AND ANAEROBIC Blood Culture adequate volume   Culture   Final    NO GROWTH < 24 HOURS Performed at [facility], [address], [place], [place] [address]    Report Status PENDING  Incomplete     Studies: ECHOCARDIOGRAM COMPLETE Result Date: 09/30/2023    ECHOCARDIOGRAM REPORT   Patient Name:   [name] Date of Exam: 09/30/2023 Medical Rec #:  [id number]          Height:       67.0 in Accession #:    [id number]         Weight:       141.5 lb Date of Birth:  Sep 30, 1980           BSA:          1.746 m Patient Age:    42 years           BP:           178/119 mmHg  Patient Gender: F                  HR:           87 bpm. Exam Location:  Inpatient Procedure: 2D Echo, Cardiac Doppler and Color Doppler (Both Spectral and Color            Flow Doppler were utilized during procedure). Indications:    Murmur  History:        Patient has no prior history of Echocardiogram examinations.                 Risk Factors:Hypertension.  Sonographer:    Lamont Snowball Referring Phys: [id number] TIMOTHY S OPYD IMPRESSIONS  1. Abnormal septal motion. Left ventricular ejection fraction, by estimation, is 60 to 65%. The left ventricle has normal function. The left ventricle has no regional wall motion abnormalities. There is moderate left ventricular hypertrophy. Left ventricular diastolic parameters are consistent with Grade I diastolic dysfunction (impaired relaxation).  2. Right ventricular systolic function is normal. The right ventricular size is normal.  3. The mitral valve is abnormal. Trivial mitral valve regurgitation. No evidence of mitral stenosis.  4. The aortic valve is tricuspid. Aortic valve regurgitation is trivial. No aortic stenosis is present.  5. The inferior vena cava is normal in size with greater than 50% respiratory variability, suggesting right atrial pressure of 3 mmHg. FINDINGS  Left Ventricle: Abnormal septal motion. Left ventricular ejection fraction, by estimation, is 60 to 65%. The left ventricle has normal function. The left ventricle has no regional wall motion abnormalities. Strain was performed and the global longitudinal strain is indeterminate. The left ventricular internal cavity size was normal in size. There is moderate left ventricular hypertrophy. Left ventricular diastolic parameters are consistent with Grade I diastolic dysfunction (  impaired relaxation). Right Ventricle: The right ventricular size is normal. No increase in right ventricular wall thickness. Right ventricular systolic function is normal. Left Atrium: Left atrial size was normal in size. Right  Atrium: Right atrial size was normal in size. Pericardium: There is no evidence of pericardial effusion. Mitral Valve: The mitral valve is abnormal. There is mild thickening of the mitral valve leaflet(s). Trivial mitral valve regurgitation. No evidence of mitral valve stenosis. MV peak gradient, 15.2 mmHg. The mean mitral valve gradient is 3.0 mmHg. Tricuspid Valve: The tricuspid valve is normal in structure. Tricuspid valve regurgitation is mild . No evidence of tricuspid stenosis. Aortic Valve: The aortic valve is tricuspid. Aortic valve regurgitation is trivial. No aortic stenosis is present. Pulmonic Valve: The pulmonic valve was normal in structure. Pulmonic valve regurgitation is not visualized. No evidence of pulmonic stenosis. Aorta: The aortic root is normal in size and structure. Venous: The inferior vena cava is normal in size with greater than 50% respiratory variability, suggesting right atrial pressure of 3 mmHg. IAS/Shunts: No atrial level shunt detected by color flow Doppler. Additional Comments: 3D was performed not requiring image post processing on an independent workstation and was indeterminate.  LEFT VENTRICLE PLAX 2D LVIDd:         3.60 cm   Diastology LVIDs:         2.70 cm   LV e' medial:    7.62 cm/s LV PW:         1.50 cm   LV E/e' medial:  5.8 LV IVS:        1.50 cm   LV e' lateral:   12.70 cm/s LVOT diam:     2.00 cm   LV E/e' lateral: 3.5 LVOT Area:     3.14 cm  RIGHT VENTRICLE             IVC RV Basal diam:  2.70 cm     IVC diam: 0.80 cm RV S prime:     14.40 cm/s TAPSE (M-mode): 2.0 cm LEFT ATRIUM             Index        RIGHT ATRIUM          Index LA Vol (A2C):   31.7 ml 18.16 ml/m  RA Area:     9.24 cm LA Vol (A4C):   25.4 ml 14.55 ml/m  RA Volume:   17.20 ml 9.85 ml/m LA Biplane Vol: 28.2 ml 16.15 ml/m   AORTA Ao Root diam: 3.00 cm Ao Asc diam:  3.50 cm MITRAL VALVE               TRICUSPID VALVE MV Area (PHT): 3.35 cm    TR Peak grad:   17.0 mmHg MV Peak grad:  15.2 mmHg    TR Vmax:        206.00 cm/s MV Mean grad:  3.0 mmHg MV Vmax:       1.95 m/s    SHUNTS MV Vmean:      77.6 cm/s   Systemic Diam: 2.00 cm MV Decel Time: 227 msec MV E velocity: 44.50 cm/s MV A velocity: 73.10 cm/s MV E/A ratio:  0.61 Charlton Haws MD Electronically signed by Charlton Haws MD Signature Date/Time: 09/30/2023/2:07:41 PM    Final    CT Angio Chest PE W/Cm &/Or Wo Cm Result Date: 09/30/2023 CLINICAL DATA:  Positive D-dimer with nausea, vomiting and abdominal pain. EXAM: CT ANGIOGRAPHY CHEST CT ABDOMEN AND PELVIS WITH CONTRAST  TECHNIQUE: Multidetector CT imaging of the chest was performed using the standard protocol during bolus administration of intravenous contrast. Multiplanar CT image reconstructions and MIPs were obtained to evaluate the vascular anatomy. Multidetector CT imaging of the abdomen and pelvis was performed using the standard protocol during bolus administration of intravenous contrast. RADIATION DOSE REDUCTION: This exam was performed according to the departmental dose-optimization program which includes automated exposure control, adjustment of the mA and/or kV according to patient size and/or use of iterative reconstruction technique. CONTRAST:  OMNIPAQUE IOHEXOL 350 MG/ML SOLN COMPARISON:  Portable chest yesterday is the only relevant prior study. FINDINGS: CTA CHEST FINDINGS Cardiovascular: Satisfactory opacification of the pulmonary arteries to the segmental level. No evidence of pulmonary embolism. Normal heart size. No pericardial effusion. Normal aorta and great vessels. Mediastinum/Nodes: No enlarged mediastinal, hilar, or axillary lymph nodes. Thyroid gland, trachea, and esophagus demonstrate no significant findings. Lungs/Pleura: There are minimal paraseptal and centrilobular emphysematous changes in the upper lung apices. There is mild generalized bronchial thickening. No consolidation, effusion or nodules are seen. Mild elevation right hemidiaphragm. Musculoskeletal: No  chest wall abnormality. No acute or significant osseous findings. Review of the MIP images confirms the above findings. CT ABDOMEN and PELVIS FINDINGS Hepatobiliary: There is a cyst posteriorly in hepatic segment 5, Hounsfield density is 21. Elsewhere there are occasional scattered subcentimeter hypodensities which too small to characterize. The liver is 19 cm length slightly steatotic, without discrete mass enhancement. Pancreas: No abnormality. Spleen: Normal in size. There are diffuse innumerable hypodense lesions within the spleen, some showing rim enhancement. Largest measures 2.1 cm medially. Differential diagnosis includes metastatic disease to the spleen, splenic lymphoma, infectious process, and granulomatous disease. Adrenals/Urinary Tract: Adrenal glands are unremarkable. Kidneys are normal, without renal calculi, focal lesion, or hydronephrosis. Bladder is unremarkable. Stomach/Bowel: No dilatation or wall thickening including the appendix. Vascular/Lymphatic: No significant vascular findings are present. No enlarged abdominal or pelvic lymph nodes. Reproductive: Limited fine detail through the pelvis due to motion artifacts. Uterus and bilateral adnexa are unremarkable. Multiple pelvic phleboliths. Other: Small umbilical fat hernia. No incarcerated hernia. No free fluid or free air. 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 No L5-S1 spondylolisthesis. No acute or other significant osseous findings. Review of the MIP images confirms the above findings. IMPRESSION: 1. No evidence of aortic dissection or pulmonary embolus. 2. Minimal biapical emphysema with mild bronchitis. 3. Diffuse innumerable hypodense lesions in the spleen, some showing rim enhancement. Differential diagnosis includes metastatic disease to the spleen, splenic lymphoma, infectious process, and granulomatous disease. Consider MRI without and with contrast as follow-up imaging. 4. Slightly enlarged  steatotic liver with a cyst in segment 5 and occasional subcentimeter hypodensities which are too small to characterize. 5. Small umbilical fat hernia. 6. 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 No L5-S1 spondylolisthesis. Emphysema (ICD10-J43.9). Electronically Signed   By: Almira Bar M.D.   On: 09/30/2023 02:04   CT ABDOMEN PELVIS W CONTRAST Result Date: 09/30/2023 CLINICAL DATA:  Positive D-dimer with nausea, vomiting and abdominal pain. EXAM: CT ANGIOGRAPHY CHEST CT ABDOMEN AND PELVIS WITH CONTRAST TECHNIQUE: Multidetector CT imaging of the chest was performed using the standard protocol during bolus administration of intravenous contrast. Multiplanar CT image reconstructions and MIPs were obtained to evaluate the vascular anatomy. Multidetector CT imaging of the abdomen and pelvis was performed using the standard protocol during bolus administration of intravenous contrast. RADIATION DOSE REDUCTION: This exam was performed according to the departmental dose-optimization program which includes automated  exposure control, adjustment of the mA and/or kV according to patient size and/or use of iterative reconstruction technique. CONTRAST:  OMNIPAQUE IOHEXOL 350 MG/ML SOLN COMPARISON:  Portable chest yesterday is the only relevant prior study. FINDINGS: CTA CHEST FINDINGS Cardiovascular: Satisfactory opacification of the pulmonary arteries to the segmental level. No evidence of pulmonary embolism. Normal heart size. No pericardial effusion. Normal aorta and great vessels. Mediastinum/Nodes: No enlarged mediastinal, hilar, or axillary lymph nodes. Thyroid gland, trachea, and esophagus demonstrate no significant findings. Lungs/Pleura: There are minimal paraseptal and centrilobular emphysematous changes in the upper lung apices. There is mild generalized bronchial thickening. No consolidation, effusion or nodules are seen. Mild elevation right hemidiaphragm. Musculoskeletal: No chest wall  abnormality. No acute or significant osseous findings. Review of the MIP images confirms the above findings. CT ABDOMEN and PELVIS FINDINGS Hepatobiliary: There is a cyst posteriorly in hepatic segment 5, Hounsfield density is 21. Elsewhere there are occasional scattered subcentimeter hypodensities which too small to characterize. The liver is 19 cm length slightly steatotic, without discrete mass enhancement. Pancreas: No abnormality. Spleen: Normal in size. There are diffuse innumerable hypodense lesions within the spleen, some showing rim enhancement. Largest measures 2.1 cm medially. Differential diagnosis includes metastatic disease to the spleen, splenic lymphoma, infectious process, and granulomatous disease. Adrenals/Urinary Tract: Adrenal glands are unremarkable. Kidneys are normal, without renal calculi, focal lesion, or hydronephrosis. Bladder is unremarkable. Stomach/Bowel: No dilatation or wall thickening including the appendix. Vascular/Lymphatic: No significant vascular findings are present. No enlarged abdominal or pelvic lymph nodes. Reproductive: Limited fine detail through the pelvis due to motion artifacts. Uterus and bilateral adnexa are unremarkable. Multiple pelvic phleboliths. Other: Small umbilical fat hernia. No incarcerated hernia. No free fluid or free air. 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 No L5-S1 spondylolisthesis. No acute or other significant osseous findings. Review of the MIP images confirms the above findings. IMPRESSION: 1. No evidence of aortic dissection or pulmonary embolus. 2. Minimal biapical emphysema with mild bronchitis. 3. Diffuse innumerable hypodense lesions in the spleen, some showing rim enhancement. Differential diagnosis includes metastatic disease to the spleen, splenic lymphoma, infectious process, and granulomatous disease. Consider MRI without and with contrast as follow-up imaging. 4. Slightly enlarged steatotic liver  with a cyst in segment 5 and occasional subcentimeter hypodensities which are too small to characterize. 5. Small umbilical fat hernia. 6. 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 No L5-S1 spondylolisthesis. Emphysema (ICD10-J43.9). Electronically Signed   By: Almira Bar M.D.   On: 09/30/2023 02:04   DG Chest Port 1 View Result Date: 09/29/2023 CLINICAL DATA:  vomiting EXAM: PORTABLE CHEST 1 VIEW COMPARISON:  None Available. FINDINGS: The cardiomediastinal contours are normal. The lungs are clear. Pulmonary vasculature is normal. No consolidation, pleural effusion, or pneumothorax. No acute osseous abnormalities are seen. IMPRESSION: No active disease. Electronically Signed   By: Narda Rutherford M.D.   On: 09/29/2023 23:39      Joycelyn Das, MD  [facility] 10/01/2023  If 7PM-7AM, please contact night-coverage

## 2023-10-01 NOTE — Plan of Care (Signed)
   Problem: Education: Goal: Knowledge of General Education information will improve Description: Including pain rating scale, medication(s)/side effects and non-pharmacologic comfort measures Outcome: Completed/Met

## 2023-10-01 NOTE — Plan of Care (Signed)
  Problem: Clinical Measurements: Goal: Will remain free from infection Outcome: Progressing   Problem: Nutrition: Goal: Adequate nutrition will be maintained Outcome: Progressing   Problem: Elimination: Goal: Will not experience complications related to bowel motility Outcome: Progressing   Problem: Safety: Goal: Ability to remain free from injury will improve Outcome: Progressing   Problem: Pain Managment: Goal: General experience of comfort will improve and/or be controlled Outcome: Progressing   Problem: Safety: Goal: Ability to remain free from injury will improve Outcome: Progressing   Problem: Skin Integrity: Goal: Risk for impaired skin integrity will decrease Outcome: Progressing

## 2023-10-02 ENCOUNTER — Inpatient Hospital Stay (HOSPITAL_COMMUNITY)

## 2023-10-02 DIAGNOSIS — D7389 Other diseases of spleen: Secondary | ICD-10-CM | POA: Diagnosis not present

## 2023-10-02 LAB — CBC
HCT: 37 % (ref 36.0–46.0)
Hemoglobin: 13.5 g/dL (ref 12.0–15.0)
MCH: 31.3 pg (ref 26.0–34.0)
MCHC: 36.5 g/dL — ABNORMAL HIGH (ref 30.0–36.0)
MCV: 85.6 fL (ref 80.0–100.0)
Platelets: 501 10*3/uL — ABNORMAL HIGH (ref 150–400)
RBC: 4.32 MIL/uL (ref 3.87–5.11)
RDW: 12.7 % (ref 11.5–15.5)
WBC: 11.1 10*3/uL — ABNORMAL HIGH (ref 4.0–10.5)
nRBC: 0 % (ref 0.0–0.2)

## 2023-10-02 LAB — BASIC METABOLIC PANEL
Anion gap: 13 (ref 5–15)
BUN: 9 mg/dL (ref 6–20)
CO2: 21 mmol/L — ABNORMAL LOW (ref 22–32)
Calcium: 9.5 mg/dL (ref 8.9–10.3)
Chloride: 107 mmol/L (ref 98–111)
Creatinine, Ser: 1.27 mg/dL — ABNORMAL HIGH (ref 0.44–1.00)
GFR, Estimated: 54 mL/min — ABNORMAL LOW (ref >60)
Glucose, Bld: 129 mg/dL — ABNORMAL HIGH (ref 70–99)
Potassium: 3.2 mmol/L — ABNORMAL LOW (ref 3.5–5.1)
Sodium: 141 mmol/L (ref 135–145)

## 2023-10-02 MED ORDER — SODIUM CHLORIDE 0.9 % IV SOLN
3.0000 g | Freq: Four times a day (QID) | INTRAVENOUS | Status: DC
  Administered 2023-10-02 – 2023-10-04 (×8): 3 g via INTRAVENOUS
  Filled 2023-10-02 (×10): qty 8

## 2023-10-02 MED ORDER — SODIUM CHLORIDE 0.9% FLUSH: 3.0000 mL | Freq: Two times a day (BID) | INTRAVENOUS | Status: DC

## 2023-10-02 MED ORDER — SODIUM CHLORIDE 0.9% FLUSH
3.0000 mL | INTRAVENOUS | Status: DC | PRN
Start: 2023-10-02 — End: 2023-10-03

## 2023-10-02 MED ORDER — SODIUM CHLORIDE 0.9% FLUSH: 10.0000 mL | INTRAVENOUS | Status: DC | PRN

## 2023-10-02 MED ORDER — SODIUM CHLORIDE 0.9% FLUSH
10.0000 mL | Freq: Two times a day (BID) | INTRAVENOUS | Status: DC
  Administered 2023-10-04 (×2): 10 mL

## 2023-10-02 MED ORDER — VANCOMYCIN HCL 1.25 G IV SOLR
1250.0000 mg | Freq: Every day | INTRAVENOUS | Status: DC
  Administered 2023-10-02 – 2023-10-03 (×2): 1250 mg via INTRAVENOUS
  Filled 2023-10-02 (×3): qty 25

## 2023-10-02 MED ORDER — POTASSIUM CHLORIDE CRYS ER 20 MEQ PO TBCR
40.0000 meq | EXTENDED_RELEASE_TABLET | Freq: Two times a day (BID) | ORAL | Status: AC
  Administered 2023-10-02 (×2): 40 meq via ORAL
  Filled 2023-10-02 (×2): qty 2

## 2023-10-02 NOTE — Progress Notes (Signed)
    [facility] has been requested to perform a transesophageal echocardiogram on [name] to rule out endocarditis given multiple splenic lesions.    The patient does NOT have any absolute or relative contraindications to a Transesophageal Echocardiogram (TEE).  The patient has: No other conditions that may impact this procedure.  After careful review of history and examination, the risks and benefits of transesophageal echocardiogram have been explained including risks of esophageal damage, perforation (1:10,000 risk), bleeding, pharyngeal hematoma as well as other potential complications associated with conscious sedation including aspiration, arrhythmia, respiratory failure and death. Alternatives to treatment were discussed, questions were answered. Patient is willing to proceed.   Signed, Corrin Parker, PA-C  10/02/2023 5:08 PM

## 2023-10-02 NOTE — H&P (View-Only) (Signed)
 [facility]    Date of Admission:  09/29/2023   Total days of inpatient antibiotics 3        Reason for Consult: Splenic lesion    Principal Problem:   Splenic lesion Active Problems:   Hypertensive urgency   Hypokalemia   Dental abscess   Assessment: 43 year old female with history of dental abscess requiring debridement in the beginning of March placed on Augmentin presented with nausea and vomiting found to have: #Splenic lesions #History of dental abscess on Augmentin - On arrival she had WBC 13K.  CT abdomen pelvis showed diffuse innumerable hypodense lesion in spleen with differential including metastatic disease, infectious process, lymphoma versus glaucomatous disease. - MRI innumerable cystic nodules. - Blood cultures from admission are negative.  TTE no vegetation.  ID engaged.  Patient is on vancomycin and ceftriaxone.  Recommendations:  -Continue vancomycin ceftriaxone - Follow-up blood cultures - I think endocarditis in the setting of dental abscess and negative blood cultures the patient has been on Augmentin with mets to spleen is possible.  As such we will plan on TEE.  Evaluation of this patient requires complex antimicrobial therapy evaluation and counseling + isolation needs for disease transmission risk assessment and mitigation   Microbiology:   Antibiotics: Vancomycin 3/7- Ceftriaxone 3/7- Unasyn  Cultures: Blood 3/7- Urine  Other   HPI: [name] is a 43 y.o. female with history of dentition recommended root canal by dentist 2 years ago, ADHD, presented with elevated blood pressure, nausea vomiting.  Patient states that her tooth has been bothering her since she went on a trip to [place] in the beginning of March.  She went to urgent care and had the area debrided and placed on Augmentin.  She then experienced nausea and nonbloody vomiting.  On arrival to the ED WBC 30.5 K.  Patient afebrile.  CT  abdomen pelvis showed innumerable hypodense splenic lesions Selmer enhancement.  ID engaged.   Review of Systems: Review of Systems  All other systems reviewed and are negative.   Past Medical History:  Diagnosis Date   ADHD (attention deficit hyperactivity disorder)    Anemia    Depression    Headache     Social History   Tobacco Use   Smoking status: Former    Current packs/day: 0.00    Types: Cigarettes    Quit date: 12/28/2013    Years since quitting: 9.7   Smokeless tobacco: Never  Substance Use Topics   Alcohol use: No   Drug use: Not Currently    Types: Marijuana    Comment: 12/28/2013    Family History  Problem Relation Age of Onset   Asthma Sister    Depression Sister    Asthma Maternal Grandmother    Drug abuse Other    Alcohol abuse Neg Hx    Arthritis Neg Hx    Birth defects Neg Hx    Cancer Neg Hx    COPD Neg Hx    Diabetes Neg Hx    Hypertension Neg Hx    Hyperlipidemia Neg Hx    Hearing loss Neg Hx    Early death Neg Hx    Heart disease Neg Hx    Kidney disease Neg Hx    Mental illness Neg Hx    Mental retardation Neg Hx    Learning disabilities Neg Hx    Miscarriages / Stillbirths Neg Hx    Stroke Neg Hx  Vision loss Neg Hx    Varicose Veins Neg Hx    Scheduled Meds:  enoxaparin (LOVENOX) injection  40 mg Subcutaneous Q24H   feeding supplement  1 Container Oral TID BM   metoprolol tartrate  25 mg Oral BID   pantoprazole  40 mg Oral Daily   sodium chloride flush  10-40 mL Intracatheter Q12H   sodium chloride flush  3 mL Intravenous Q12H   sodium chloride flush  3-10 mL Intravenous Q12H   Continuous Infusions:  ampicillin-sulbactam (UNASYN) IV 3 g (10/02/23 2128)   Vancomycin (VANCOCIN) 1,250 mg in sodium chloride 0.9 % 250 mL IVPB     PRN Meds:.acetaminophen **OR** acetaminophen, alum & mag hydroxide-simeth, hydrALAZINE, hydrALAZINE, ondansetron **OR** ondansetron (ZOFRAN) IV, mouth rinse, oxyCODONE, polyethylene glycol, sodium  chloride flush, sodium chloride flush No Known Allergies  OBJECTIVE: Blood pressure (!) 145/97, pulse (!) 103, temperature 98.2 F (36.8 C), temperature source Oral, resp. rate 18, height 5\' 7"  (1.702 m), weight 64.2 kg, SpO2 100%, unknown if currently breastfeeding.  Physical Exam Constitutional:      Appearance: Normal appearance.  HENT:     Head: Normocephalic and atraumatic.     Right Ear: Tympanic membrane normal.     Left Ear: Tympanic membrane normal.     Nose: Nose normal.     Mouth/Throat:     Mouth: Mucous membranes are moist.  Eyes:     Extraocular Movements: Extraocular movements intact.     Conjunctiva/sclera: Conjunctivae normal.     Pupils: Pupils are equal, round, and reactive to light.  Cardiovascular:     Rate and Rhythm: Normal rate and regular rhythm.     Heart sounds: No murmur heard.    No friction rub. No gallop.  Pulmonary:     Effort: Pulmonary effort is normal.     Breath sounds: Normal breath sounds.  Abdominal:     General: Abdomen is flat.     Palpations: Abdomen is soft.  Musculoskeletal:        General: Normal range of motion.  Skin:    General: Skin is warm and dry.  Neurological:     General: No focal deficit present.     Mental Status: She is alert and oriented to person, place, and time.  Psychiatric:        Mood and Affect: Mood normal.     Lab Results Lab Results  Component Value Date   WBC 11.1 (H) 10/02/2023   HGB 13.5 10/02/2023   HCT 37.0 10/02/2023   MCV 85.6 10/02/2023   PLT 501 (H) 10/02/2023    Lab Results  Component Value Date   CREATININE 1.27 (H) 10/02/2023   BUN 9 10/02/2023   NA 141 10/02/2023   K 3.2 (L) 10/02/2023   CL 107 10/02/2023   CO2 21 (L) 10/02/2023    Lab Results  Component Value Date   ALT 23 10/01/2023   AST 23 10/01/2023   ALKPHOS 46 10/01/2023   BILITOT 0.5 10/01/2023       Danelle Earthly, MD [facility] 10/02/2023, 10:17 PM

## 2023-10-02 NOTE — Consult Note (Signed)
 [facility]    Date of Admission:  09/29/2023   Total days of inpatient antibiotics 3        Reason for Consult: Splenic lesion    Principal Problem:   Splenic lesion Active Problems:   Hypertensive urgency   Hypokalemia   Dental abscess   Assessment: 43 year old female with history of dental abscess requiring debridement in the beginning of March placed on Augmentin presented with nausea and vomiting found to have: #Splenic lesions #History of dental abscess on Augmentin - On arrival she had WBC 13K.  CT abdomen pelvis showed diffuse innumerable hypodense lesion in spleen with differential including metastatic disease, infectious process, lymphoma versus glaucomatous disease. - MRI innumerable cystic nodules. - Blood cultures from admission are negative.  TTE no vegetation.  ID engaged.  Patient is on vancomycin and ceftriaxone.  Recommendations:  -Continue vancomycin ceftriaxone - Follow-up blood cultures - I think endocarditis in the setting of dental abscess and negative blood cultures the patient has been on Augmentin with mets to spleen is possible.  As such we will plan on TEE.  Evaluation of this patient requires complex antimicrobial therapy evaluation and counseling + isolation needs for disease transmission risk assessment and mitigation   Microbiology:   Antibiotics: Vancomycin 3/7- Ceftriaxone 3/7- Unasyn  Cultures: Blood 3/7- Urine  Other   HPI: [name] is a 43 y.o. female with history of dentition recommended root canal by dentist 2 years ago, ADHD, presented with elevated blood pressure, nausea vomiting.  Patient states that her tooth has been bothering her since she went on a trip to [place] in the beginning of March.  She went to urgent care and had the area debrided and placed on Augmentin.  She then experienced nausea and nonbloody vomiting.  On arrival to the ED WBC 30.5 K.  Patient afebrile.  CT  abdomen pelvis showed innumerable hypodense splenic lesions Selmer enhancement.  ID engaged.   Review of Systems: Review of Systems  All other systems reviewed and are negative.   Past Medical History:  Diagnosis Date   ADHD (attention deficit hyperactivity disorder)    Anemia    Depression    Headache     Social History   Tobacco Use   Smoking status: Former    Current packs/day: 0.00    Types: Cigarettes    Quit date: 12/28/2013    Years since quitting: 9.7   Smokeless tobacco: Never  Substance Use Topics   Alcohol use: No   Drug use: Not Currently    Types: Marijuana    Comment: 12/28/2013    Family History  Problem Relation Age of Onset   Asthma Sister    Depression Sister    Asthma Maternal Grandmother    Drug abuse Other    Alcohol abuse Neg Hx    Arthritis Neg Hx    Birth defects Neg Hx    Cancer Neg Hx    COPD Neg Hx    Diabetes Neg Hx    Hypertension Neg Hx    Hyperlipidemia Neg Hx    Hearing loss Neg Hx    Early death Neg Hx    Heart disease Neg Hx    Kidney disease Neg Hx    Mental illness Neg Hx    Mental retardation Neg Hx    Learning disabilities Neg Hx    Miscarriages / Stillbirths Neg Hx    Stroke Neg Hx  Vision loss Neg Hx    Varicose Veins Neg Hx    Scheduled Meds:  enoxaparin (LOVENOX) injection  40 mg Subcutaneous Q24H   feeding supplement  1 Container Oral TID BM   metoprolol tartrate  25 mg Oral BID   pantoprazole  40 mg Oral Daily   sodium chloride flush  10-40 mL Intracatheter Q12H   sodium chloride flush  3 mL Intravenous Q12H   sodium chloride flush  3-10 mL Intravenous Q12H   Continuous Infusions:  ampicillin-sulbactam (UNASYN) IV 3 g (10/02/23 2128)   Vancomycin (VANCOCIN) 1,250 mg in sodium chloride 0.9 % 250 mL IVPB     PRN Meds:.acetaminophen **OR** acetaminophen, alum & mag hydroxide-simeth, hydrALAZINE, hydrALAZINE, ondansetron **OR** ondansetron (ZOFRAN) IV, mouth rinse, oxyCODONE, polyethylene glycol, sodium  chloride flush, sodium chloride flush No Known Allergies  OBJECTIVE: Blood pressure (!) 145/97, pulse (!) 103, temperature 98.2 F (36.8 C), temperature source Oral, resp. rate 18, height 5\' 7"  (1.702 m), weight 64.2 kg, SpO2 100%, unknown if currently breastfeeding.  Physical Exam Constitutional:      Appearance: Normal appearance.  HENT:     Head: Normocephalic and atraumatic.     Right Ear: Tympanic membrane normal.     Left Ear: Tympanic membrane normal.     Nose: Nose normal.     Mouth/Throat:     Mouth: Mucous membranes are moist.  Eyes:     Extraocular Movements: Extraocular movements intact.     Conjunctiva/sclera: Conjunctivae normal.     Pupils: Pupils are equal, round, and reactive to light.  Cardiovascular:     Rate and Rhythm: Normal rate and regular rhythm.     Heart sounds: No murmur heard.    No friction rub. No gallop.  Pulmonary:     Effort: Pulmonary effort is normal.     Breath sounds: Normal breath sounds.  Abdominal:     General: Abdomen is flat.     Palpations: Abdomen is soft.  Musculoskeletal:        General: Normal range of motion.  Skin:    General: Skin is warm and dry.  Neurological:     General: No focal deficit present.     Mental Status: She is alert and oriented to person, place, and time.  Psychiatric:        Mood and Affect: Mood normal.     Lab Results Lab Results  Component Value Date   WBC 11.1 (H) 10/02/2023   HGB 13.5 10/02/2023   HCT 37.0 10/02/2023   MCV 85.6 10/02/2023   PLT 501 (H) 10/02/2023    Lab Results  Component Value Date   CREATININE 1.27 (H) 10/02/2023   BUN 9 10/02/2023   NA 141 10/02/2023   K 3.2 (L) 10/02/2023   CL 107 10/02/2023   CO2 21 (L) 10/02/2023    Lab Results  Component Value Date   ALT 23 10/01/2023   AST 23 10/01/2023   ALKPHOS 46 10/01/2023   BILITOT 0.5 10/01/2023       Danelle Earthly, MD [facility] 10/02/2023, 10:17 PM

## 2023-10-02 NOTE — Progress Notes (Signed)
 PROGRESS NOTE  [name] WUJ:[id number] DOB: Oct 08, 1980 DOA: 09/29/2023 PCP: Patient, No Pcp Per   LOS: 2 days   Brief narrative:  [name] is a 43 y.o. female with medical history significant for depression and ADHD presented to hospital with elevated blood pressure nausea and vomiting.  Patient stated that she was okay until 09/23/2023 when she had alcoholic beverages and marijuana subsequently started having nausea and recurrent vomiting at that time EMS was called in and received IV fluids and antiemetics with some improvement but subsequently got worse and then continued to experience nausea and vomiting and fatigue.  In the ED patient was tachycardic with elevated blood pressure.  Labs showed hypokalemia with potassium of 3.4 and WBC was elevated at 13.5 K.  Respiratory viral panel was negative.  CTA of the chest was negative for PE.  CT scan of the abdomen pelvis showed diffuse  hypodense splenic lesions, some with rim enhancement.  Urinalysis showed protein with 11-20 white cells.  No nitrites.  Lactate was 1.2.  D-dimer was elevated more than 2 so CT angiogram of the chest was performed which showed no evidence of pulmonary embolism but apical emphysema with mild bronchitis and hypodense lesions in the spleen some swelling remained has been.  Pregnancy test was negative.  Lipase was 25.  Blood cultures were collected and patient was given 2 L of Ringer lactate solution vancomycin and Rocephin and was admitted to the  hospital for further evaluation and treatment.     Assessment/Plan: Principal Problem:   Splenic lesion Active Problems:   Hypertensive urgency   Hypokalemia   Dental abscess  Splenic lesions. Uncertain etiology.  Multiple hypodense lesions with rim enhancement differentials including splenic lymphoma infectious process could be embolic lesions from infective endocarditis.  Patient noted to have murmur but patient saying that she had it from childhood.   Continue empiric antibiotic with vancomycin and Rocephin.  Blood cultures negative in 2 days.  Urine drug screen positive for benzodiazepines and THC.  D-dimer was elevated.  CT angiogram of the chest however negative for PE.  HIV nonreactive.  2D echocardiogram shows abnormal septal wall motion but LV ejection fraction of 60 to 65% with grade 1 diastolic dysfunction.  Tribal mitral valve regurgitation and aortic valve regurgitation. Mri of the abdomen with hypodense lesions possibility infective in etiology.  Communicated with infectious disease for consultation.    Nausea vomiting.  Resolved   Hypertensive urgency  Not on antihypertensives at home.  Likely new onset hypertension.  Will start the patient on beta-blocker since she is slightly tachycardic as well.  Has been started on metoprolol twice daily since yesterday.  Will continue to monitor closely.  Might need second agent if continues to be high.   Hypokalemia  Potassium of 3.2 today.  Will continue to replace.  Check levels in AM.  Mild leukocytosis.  Initial WBC at 13.5.  WBC trending down to 11.1 today.   Dental abscess  Stable for I&D.  Improving.  Continue with antibiotic.  Patient stated that she had been having problems for the last 2 years and a that she was told to have root canal treatment but still has not had a chance.  Has a dental appointment as outpatient.  Heartburn indigestion.  on Protonix and maalox.  Cannabis use.  Counseled about it.  No nausea vomiting.  DVT prophylaxis: enoxaparin (LOVENOX) injection 40 mg Start: 09/30/23 1000   Disposition: Likely home in 1 to 2 days when okay with ID.  Status is: Inpatient  Remains inpatient appropriate because: Pending clinical improvement, further workup, IV antibiotic, will need for TEE    Code Status:     Code Status: Full Code  Family Communication: , spoke with the patient's mother on the phone on 10/01/2023.  Consultants: Infectious disease  Procedures: 2D  echocardiogram.  Anti-infectives:  Rocephin and vancomycin IV.  Anti-infectives (From admission, onward)    Start     Dose/Rate Route Frequency Ordered Stop   10/02/23 1200  Ampicillin-Sulbactam (UNASYN) 3 g in sodium chloride 0.9 % 100 mL IVPB        3 g 200 mL/hr over 30 Minutes Intravenous Every 6 hours 10/02/23 1035     09/30/23 2200  cefTRIAXone (ROCEPHIN) 2 g in sodium chloride 0.9 % 100 mL IVPB  Status:  Discontinued        2 g 200 mL/hr over 30 Minutes Intravenous Every 24 hours 09/30/23 0326 10/02/23 1035   09/30/23 2200  vancomycin (VANCOREADY) IVPB 1250 mg/250 mL        1,250 mg 166.7 mL/hr over 90 Minutes Intravenous Every 24 hours 09/30/23 0358     09/30/23 0300  vancomycin (VANCOCIN) IVPB 1000 mg/200 mL premix        1,000 mg 200 mL/hr over 60 Minutes Intravenous  Once 09/30/23 0246 09/30/23 0530   09/29/23 2200  cefTRIAXone (ROCEPHIN) 2 g in sodium chloride 0.9 % 100 mL IVPB        2 g 200 mL/hr over 30 Minutes Intravenous  Once 09/29/23 2153 09/29/23 2237       Subjective: Today, patient was seen and examined at bedside.  Patient is little anxious that she would need to go through TEE as per ID.  Explained to her the procedure.  Denies any nausea vomiting fever chills or rigor.  Objective: Vitals:   10/02/23 0750 10/02/23 0900  BP: (!) 160/103   Pulse: 84 84  Resp:    Temp:    SpO2: 100%     Intake/Output Summary (Last 24 hours) at 10/02/2023 1148 Last data filed at 10/02/2023 0600 Gross per 24 hour  Intake 1429.86 ml  Output 0 ml  Net 1429.86 ml   Filed Weights   09/29/23 1841 09/30/23 0456  Weight: 59 kg 64.2 kg   Body mass index is 22.16 kg/m.   Physical Exam:  GENERAL: Patient is alert awake and oriented. Not in obvious distress. HENT: No scleral pallor or icterus. Pupils equally reactive to light. Oral mucosa is moist, dental area with mild swelling. NECK: is supple, no gross swelling noted. CHEST: Clear to auscultation. No crackles or  wheezes.  CVS: S1 and S2 heard, no murmur. Regular rate and rhythm.  ABDOMEN: Soft, non-tender, bowel sounds are present. EXTREMITIES: No edema. CNS: Cranial nerves are intact. No focal motor deficits. SKIN: warm and dry without rashes.  Data Review: I have personally reviewed the following laboratory data and studies,  CBC: Recent Labs  Lab 09/29/23 1921 09/30/23 0759 10/01/23 1249 10/02/23 1013  WBC 13.5* 14.8* 13.3* 11.1*  HGB 16.0* 13.7 13.5 13.5  HCT 43.1 36.7 37.3 37.0  MCV 84.5 83.2 85.0 85.6  PLT 363 298 438* 501*   Basic Metabolic Panel: Recent Labs  Lab 09/29/23 1921 09/30/23 0759 10/01/23 1249 10/02/23 1013  NA 135 136 138 141  K 3.4* 3.4* 3.5 3.2*  CL 103 105 108 107  CO2 21* 20* 21* 21*  GLUCOSE 117* 103* 110* 129*  BUN 25* 15 12 9  CREATININE 1.01* 0.98 0.88 1.27*  CALCIUM 9.8 9.0 9.0 9.5  MG  --  1.8 1.9  --    Liver Function Tests: Recent Labs  Lab 09/29/23 1921 10/01/23 1249  AST 13* 23  ALT 17 23  ALKPHOS 58 46  BILITOT 0.6 0.5  PROT 8.6* 7.0  ALBUMIN 4.6 3.3*   Recent Labs  Lab 09/29/23 1921  LIPASE 25   No results for input(s): "AMMONIA" in the last 168 hours. Cardiac Enzymes: No results for input(s): "CKTOTAL", "CKMB", "CKMBINDEX", "TROPONINI" in the last 168 hours. BNP (last 3 results) No results for input(s): "BNP" in the last 8760 hours.  ProBNP (last 3 results) No results for input(s): "PROBNP" in the last 8760 hours.  CBG: No results for input(s): "GLUCAP" in the last 168 hours. Recent Results (from the past 240 hours)  Blood culture (routine x 2)     Status: None (Preliminary result)   Collection Time: 09/29/23  9:32 PM   Specimen: BLOOD  Result Value Ref Range Status   Specimen Description   Final    BLOOD UNKNOWN Performed at [facility], [address], [place], [place] [address]    Special Requests   Final    BOTTLES DRAWN AEROBIC AND ANAEROBIC Blood Culture adequate volume Performed at  [facility], [address] [place], [place] [address]    Culture   Final    NO GROWTH 2 DAYS Performed at [facility], [address] [place], [place] [address]    Report Status PENDING  Incomplete  Resp panel by RT-PCR (RSV, Flu A&B, Covid) Urine, Clean Catch     Status: None   Collection Time: 09/29/23  9:33 PM   Specimen: Urine, Clean Catch; Nasal Swab  Result Value Ref Range Status   SARS Coronavirus 2 by RT PCR NEGATIVE NEGATIVE Final    Comment: (NOTE) SARS-CoV-2 target nucleic acids are NOT DETECTED.  The SARS-CoV-2 RNA is generally detectable in upper respiratory specimens during the acute phase of infection. The lowest concentration of SARS-CoV-2 viral copies this assay can detect is 138 copies/mL. A negative result does not preclude SARS-Cov-2 infection and should not be used as the sole basis for treatment or other patient management decisions. A negative result may occur with  improper specimen collection/handling, submission of specimen other than nasopharyngeal swab, presence of viral mutation(s) within the areas targeted by this assay, and inadequate number of viral copies(<138 copies/mL). A negative result must be combined with clinical observations, patient history, and epidemiological information. The expected result is Negative.  Fact Sheet for Patients:  BloggerCourse.com  Fact Sheet for Healthcare Providers:  SeriousBroker.it  This test is no t yet approved or cleared by the [place] FDA and  has been authorized for detection and/or diagnosis of SARS-CoV-2 by FDA under an Emergency Use Authorization (EUA). This EUA will remain  in effect (meaning this test can be used) for the duration of the COVID-19 declaration under Section 564(b)(1) of the Act, 21 U.S.C.section 360bbb-3(b)(1), unless the authorization is terminated  or revoked sooner.       Influenza A by PCR  NEGATIVE NEGATIVE Final   Influenza B by PCR NEGATIVE NEGATIVE Final    Comment: (NOTE) The Xpert Xpress SARS-CoV-2/FLU/RSV plus assay is intended as an aid in the diagnosis of influenza from Nasopharyngeal swab specimens and should not be used as a sole basis for treatment. Nasal washings and aspirates are unacceptable for Xpert Xpress SARS-CoV-2/FLU/RSV testing.  Fact Sheet  for Patients: BloggerCourse.com  Fact Sheet for Healthcare Providers: SeriousBroker.it  This test is not yet approved or cleared by the [place] FDA and has been authorized for detection and/or diagnosis of SARS-CoV-2 by FDA under an Emergency Use Authorization (EUA). This EUA will remain in effect (meaning this test can be used) for the duration of the COVID-19 declaration under Section 564(b)(1) of the Act, 21 U.S.C. section 360bbb-3(b)(1), unless the authorization is terminated or revoked.     Resp Syncytial Virus by PCR NEGATIVE NEGATIVE Final    Comment: (NOTE) Fact Sheet for Patients: BloggerCourse.com  Fact Sheet for Healthcare Providers: SeriousBroker.it  This test is not yet approved or cleared by the [place] FDA and has been authorized for detection and/or diagnosis of SARS-CoV-2 by FDA under an Emergency Use Authorization (EUA). This EUA will remain in effect (meaning this test can be used) for the duration of the COVID-19 declaration under Section 564(b)(1) of the Act, 21 U.S.C. section 360bbb-3(b)(1), unless the authorization is terminated or revoked.  Performed at [facility], [address], [place], [place] [address]   Culture, blood (routine x 2)     Status: None (Preliminary result)   Collection Time: 09/29/23  9:50 PM   Specimen: BLOOD RIGHT ARM  Result Value Ref Range Status   Specimen Description BLOOD RIGHT ARM  Final   Special Requests   Final     BOTTLES DRAWN AEROBIC AND ANAEROBIC Blood Culture adequate volume   Culture   Final    NO GROWTH 2 DAYS Performed at [facility], [address], [place], [place] [address]    Report Status PENDING  Incomplete     Studies: MR ABDOMEN W WO CONTRAST Result Date: 10/01/2023 CLINICAL DATA:  Splenic lesions on CT scan. EXAM: MRI ABDOMEN WITHOUT AND WITH CONTRAST TECHNIQUE: Multiplanar multisequence MR imaging of the abdomen was performed both before and after the administration of intravenous contrast. CONTRAST:  6mL GADAVIST GADOBUTROL 1 MMOL/ML IV SOLN COMPARISON:  CT scan 09/30/2023. FINDINGS: Lower chest: Unremarkable. Hepatobiliary: 13 mm simple cyst noted posterior right liver. 12 mm multi lobular cyst in the medial right liver towards the caudate lobe may have some thin smooth internal septation, but imaging features are generally benign. No followup imaging is recommended. Additional tiny T2 hyperintensities are too small to characterize but compatible with tiny cysts or benign biliary hamartomas. No followup imaging is recommended. Liver otherwise unremarkable. There is no evidence for gallstones, gallbladder wall thickening, or pericholecystic fluid. No intrahepatic or extrahepatic biliary dilation. Pancreas: No focal mass lesion. No dilatation of the main duct. No intraparenchymal cyst. No peripancreatic edema. Spleen: No splenomegaly. As noted on the prior study, there are innumerable splenic nodules measuring up to maximum size of about 2.4 cm. These are relatively isointense to background splenic parenchyma on T1 imaging and only minimally hyperintense on T2 imaging. Nodules hypo enhance after IV contrast administration but delayed imaging shows enhancement close to background parenchymal levels. Adrenals/Urinary Tract: No adrenal nodule or mass. Kidneys unremarkable. Stomach/Bowel: Stomach is unremarkable. No gastric wall thickening. No evidence of outlet obstruction. Duodenum is normally  positioned as is the ligament of Treitz. No small bowel or colonic dilatation within the visualized abdomen. Vascular/Lymphatic: No abdominal aortic aneurysm. No abdominal lymphadenopathy. Other:  No intraperitoneal free fluid. Musculoskeletal: No focal suspicious marrow enhancement within the visualized bony anatomy. IMPRESSION: 1. Innumerable splenic nodules measuring up to maximum size of about 2.4 cm. These are relatively isointense to background splenic  parenchyma on T1 imaging and only minimally hyperintense on T2 imaging. Nodules hypo enhance after IV contrast administration but delayed imaging shows enhancement close to background parenchymal levels. Imaging features are nonspecific. Infectious etiology is a consideration and atypical agents can be seen in the setting of immunocompromise hosts. Sarcoidosis would be a consideration although the lesions do enhance on delayed postcontrast imaging and splenic sarcoidosis is typically seen in the setting of sarcoidosis elsewhere. Multiple venous malformations are a consideration although there are more lesions than typically seen in that setting. Metastatic disease to the spleen is rare and lymphoma of the spleen is usually secondary, with lymphomatous disease present elsewhere. Primary splenic neoplasm is rare. Close follow-up warranted. 2. Benign hepatic cysts and/or biliary hamartomas. No followup imaging is recommended. Electronically Signed   By: Kennith Center M.D.   On: 10/01/2023 12:56   ECHOCARDIOGRAM COMPLETE Result Date: 09/30/2023    ECHOCARDIOGRAM REPORT   Patient Name:   LATEISHA THURLOW Date of Exam: 09/30/2023 Medical Rec #:  [id number]          Height:       67.0 in Accession #:    [id number]         Weight:       141.5 lb Date of Birth:  March 05, 1981           BSA:          1.746 m Patient Age:    42 years           BP:           178/119 mmHg Patient Gender: F                  HR:           87 bpm. Exam Location:  Inpatient Procedure: 2D Echo,  Cardiac Doppler and Color Doppler (Both Spectral and Color            Flow Doppler were utilized during procedure). Indications:    Murmur  History:        Patient has no prior history of Echocardiogram examinations.                 Risk Factors:Hypertension.  Sonographer:    Lamont Snowball Referring Phys: [id number] TIMOTHY S OPYD IMPRESSIONS  1. Abnormal septal motion. Left ventricular ejection fraction, by estimation, is 60 to 65%. The left ventricle has normal function. The left ventricle has no regional wall motion abnormalities. There is moderate left ventricular hypertrophy. Left ventricular diastolic parameters are consistent with Grade I diastolic dysfunction (impaired relaxation).  2. Right ventricular systolic function is normal. The right ventricular size is normal.  3. The mitral valve is abnormal. Trivial mitral valve regurgitation. No evidence of mitral stenosis.  4. The aortic valve is tricuspid. Aortic valve regurgitation is trivial. No aortic stenosis is present.  5. The inferior vena cava is normal in size with greater than 50% respiratory variability, suggesting right atrial pressure of 3 mmHg. FINDINGS  Left Ventricle: Abnormal septal motion. Left ventricular ejection fraction, by estimation, is 60 to 65%. The left ventricle has normal function. The left ventricle has no regional wall motion abnormalities. Strain was performed and the global longitudinal strain is indeterminate. The left ventricular internal cavity size was normal in size. There is moderate left ventricular hypertrophy. Left ventricular diastolic parameters are consistent with Grade I diastolic dysfunction (impaired relaxation). Right Ventricle: The right ventricular size is normal. No increase in right ventricular wall thickness. Right  ventricular systolic function is normal. Left Atrium: Left atrial size was normal in size. Right Atrium: Right atrial size was normal in size. Pericardium: There is no evidence of pericardial  effusion. Mitral Valve: The mitral valve is abnormal. There is mild thickening of the mitral valve leaflet(s). Trivial mitral valve regurgitation. No evidence of mitral valve stenosis. MV peak gradient, 15.2 mmHg. The mean mitral valve gradient is 3.0 mmHg. Tricuspid Valve: The tricuspid valve is normal in structure. Tricuspid valve regurgitation is mild . No evidence of tricuspid stenosis. Aortic Valve: The aortic valve is tricuspid. Aortic valve regurgitation is trivial. No aortic stenosis is present. Pulmonic Valve: The pulmonic valve was normal in structure. Pulmonic valve regurgitation is not visualized. No evidence of pulmonic stenosis. Aorta: The aortic root is normal in size and structure. Venous: The inferior vena cava is normal in size with greater than 50% respiratory variability, suggesting right atrial pressure of 3 mmHg. IAS/Shunts: No atrial level shunt detected by color flow Doppler. Additional Comments: 3D was performed not requiring image post processing on an independent workstation and was indeterminate.  LEFT VENTRICLE PLAX 2D LVIDd:         3.60 cm   Diastology LVIDs:         2.70 cm   LV e' medial:    7.62 cm/s LV PW:         1.50 cm   LV E/e' medial:  5.8 LV IVS:        1.50 cm   LV e' lateral:   12.70 cm/s LVOT diam:     2.00 cm   LV E/e' lateral: 3.5 LVOT Area:     3.14 cm  RIGHT VENTRICLE             IVC RV Basal diam:  2.70 cm     IVC diam: 0.80 cm RV S prime:     14.40 cm/s TAPSE (M-mode): 2.0 cm LEFT ATRIUM             Index        RIGHT ATRIUM          Index LA Vol (A2C):   31.7 ml 18.16 ml/m  RA Area:     9.24 cm LA Vol (A4C):   25.4 ml 14.55 ml/m  RA Volume:   17.20 ml 9.85 ml/m LA Biplane Vol: 28.2 ml 16.15 ml/m   AORTA Ao Root diam: 3.00 cm Ao Asc diam:  3.50 cm MITRAL VALVE               TRICUSPID VALVE MV Area (PHT): 3.35 cm    TR Peak grad:   17.0 mmHg MV Peak grad:  15.2 mmHg   TR Vmax:        206.00 cm/s MV Mean grad:  3.0 mmHg MV Vmax:       1.95 m/s    SHUNTS MV  Vmean:      77.6 cm/s   Systemic Diam: 2.00 cm MV Decel Time: 227 msec MV E velocity: 44.50 cm/s MV A velocity: 73.10 cm/s MV E/A ratio:  0.61 Charlton Haws MD Electronically signed by Charlton Haws MD Signature Date/Time: 09/30/2023/2:07:41 PM    Final       Joycelyn Das, MD  [facility] 10/02/2023  If 7PM-7AM, please contact night-coverage

## 2023-10-02 NOTE — TOC CM/SW Note (Signed)
 Transition of Care [facility]) - Inpatient Brief Assessment   Patient Details  Name: [name] MRN: [id number] Date of Birth: 10-20-1980  Transition of Care [facility]) CM/SW Contact:    Tom-Johnson, Hershal Coria, RN Phone Number: 10/02/2023, 3:58 PM   Clinical Narrative:  Patient presented to the [facility] with elevated BP, N/V. Patient states she was recently in [place], states she thought she was having elevation sickness. Patient was diagnosed with Dental Abscess and was started on Augmentin.  CT of the Abdomen/Pelvis showed diffuse innumerable Hypodense Splenic Lesions. Started on IV abx, sent to [facility] for further Management. Plan for possible TEE.  Patient has hx of Alcohol use, Marijuana use, Depression and ADHD.   From home with mom, two children and sister. Independent with care, drive self. Does not have DME's at home.  PCP is Ellender Hose, NP and uses [facility] on [address]   Patient not Medically ready for discharge.  CM will continue to follow as patient progresses with care towards discharge.        Transition of Care Asessment: Insurance and Status: Insurance coverage has been reviewed Patient has primary care physician: Yes Home environment has been reviewed: Yes Prior level of function:: Independent Prior/Current Home Services: No current home services   Readmission risk has been reviewed: Yes Transition of care needs: no transition of care needs at this time

## 2023-10-02 NOTE — Plan of Care (Signed)
  Problem: Clinical Measurements: Goal: Diagnostic test results will improve Outcome: Progressing Goal: Respiratory complications will improve Outcome: Completed/Met Goal: Cardiovascular complication will be avoided Outcome: Completed/Met

## 2023-10-02 NOTE — Progress Notes (Signed)

## 2023-10-03 ENCOUNTER — Encounter (HOSPITAL_COMMUNITY)
Admission: EM | Disposition: A | Discharge status: Home / Self Care | Payer: Self-pay | Source: Home / Self Care | Attending: Internal Medicine

## 2023-10-03 ENCOUNTER — Inpatient Hospital Stay (HOSPITAL_COMMUNITY)

## 2023-10-03 ENCOUNTER — Inpatient Hospital Stay (HOSPITAL_COMMUNITY): Admitting: Anesthesiology

## 2023-10-03 ENCOUNTER — Ambulatory Visit: Payer: Medicaid Other | Admitting: Family Medicine

## 2023-10-03 DIAGNOSIS — D7389 Other diseases of spleen: Secondary | ICD-10-CM | POA: Diagnosis not present

## 2023-10-03 DIAGNOSIS — Q2112 Patent foramen ovale: Secondary | ICD-10-CM

## 2023-10-03 DIAGNOSIS — I371 Nonrheumatic pulmonary valve insufficiency: Secondary | ICD-10-CM

## 2023-10-03 HISTORY — PX: TRANSESOPHAGEAL ECHOCARDIOGRAM (CATH LAB): EP1270

## 2023-10-03 LAB — BASIC METABOLIC PANEL
Anion gap: 9 (ref 5–15)
BUN: 9 mg/dL (ref 6–20)
CO2: 19 mmol/L — ABNORMAL LOW (ref 22–32)
Calcium: 8.6 mg/dL — ABNORMAL LOW (ref 8.9–10.3)
Chloride: 111 mmol/L (ref 98–111)
Creatinine, Ser: 1.04 mg/dL — ABNORMAL HIGH (ref 0.44–1.00)
GFR, Estimated: >60 mL/min (ref >60)
Glucose, Bld: 95 mg/dL (ref 70–99)
Potassium: 4.1 mmol/L (ref 3.5–5.1)
Sodium: 139 mmol/L (ref 135–145)

## 2023-10-03 LAB — MRSA NEXT GEN BY PCR, NASAL: MRSA by PCR Next Gen: NOT DETECTED

## 2023-10-03 LAB — CBC
HCT: 31.5 % — ABNORMAL LOW (ref 36.0–46.0)
Hemoglobin: 11.4 g/dL — ABNORMAL LOW (ref 12.0–15.0)
MCH: 31.4 pg (ref 26.0–34.0)
MCHC: 36.2 g/dL — ABNORMAL HIGH (ref 30.0–36.0)
MCV: 86.8 fL (ref 80.0–100.0)
Platelets: 407 10*3/uL — ABNORMAL HIGH (ref 150–400)
RBC: 3.63 MIL/uL — ABNORMAL LOW (ref 3.87–5.11)
RDW: 12.8 % (ref 11.5–15.5)
WBC: 9.2 10*3/uL (ref 4.0–10.5)
nRBC: 0 % (ref 0.0–0.2)

## 2023-10-03 SURGERY — TRANSESOPHAGEAL ECHOCARDIOGRAM (TEE) (CATHLAB)
Anesthesia: Monitor Anesthesia Care

## 2023-10-03 MED ORDER — PROPOFOL 10 MG/ML IV BOLUS
INTRAVENOUS | Status: DC | PRN
  Administered 2023-10-03 (×3): 50 mg via INTRAVENOUS
  Administered 2023-10-03: 100 mg via INTRAVENOUS
  Administered 2023-10-03: 50 mg via INTRAVENOUS

## 2023-10-03 MED ORDER — AMLODIPINE BESYLATE 5 MG PO TABS
5.0000 mg | ORAL_TABLET | Freq: Every day | ORAL | Status: DC
  Administered 2023-10-03 – 2023-10-04 (×2): 5 mg via ORAL
  Filled 2023-10-03 (×2): qty 1

## 2023-10-03 MED ORDER — LIDOCAINE 2% (20 MG/ML) 5 ML SYRINGE
INTRAMUSCULAR | Status: DC | PRN
  Administered 2023-10-03: 100 mg via INTRAVENOUS

## 2023-10-03 MED ORDER — SODIUM CHLORIDE 0.9 % IV SOLN
INTRAVENOUS | Status: DC | PRN
Start: 2023-10-03 — End: 2023-10-03

## 2023-10-03 NOTE — Anesthesia Preprocedure Evaluation (Signed)
 Anesthesia Evaluation  Patient identified by MRN, date of birth, ID band Patient awake    Reviewed: Allergy & Precautions, NPO status , Patient's Chart, lab work & pertinent test results, reviewed documented beta blocker date and time   History of Anesthesia Complications Negative for: history of anesthetic complications  Airway Mallampati: III  TM Distance: >3 FB     Dental no notable dental hx.    Pulmonary neg COPD, former smoker, neg PE   breath sounds clear to auscultation       Cardiovascular hypertension, Pt. on medications (-) CAD and (-) Past MI  Rhythm:Regular Rate:Normal  C/f endocarditis   Neuro/Psych  Headaches, neg Seizures PSYCHIATRIC DISORDERS  Depression       GI/Hepatic hiatal hernia,neg GERD  ,,(+) neg Cirrhosis        Endo/Other  neg diabetes    Renal/GU Renal disease     Musculoskeletal   Abdominal   Peds  Hematology  (+) Blood dyscrasia, anemia   Anesthesia Other Findings   Reproductive/Obstetrics                              Anesthesia Physical Anesthesia Plan  ASA: 2  Anesthesia Plan: MAC   Post-op Pain Management:    Induction:   PONV Risk Score and Plan: 2 and Ondansetron and Propofol infusion  Airway Management Planned: Nasal Cannula and Natural Airway  Additional Equipment:   Intra-op Plan:   Post-operative Plan:   Informed Consent: I have reviewed the patients History and Physical, chart, labs and discussed the procedure including the risks, benefits and alternatives for the proposed anesthesia with the patient or authorized representative who has indicated his/her understanding and acceptance.     Dental advisory given  Plan Discussed with: CRNA  Anesthesia Plan Comments:          Anesthesia Quick Evaluation

## 2023-10-03 NOTE — Progress Notes (Signed)
 PROGRESS NOTE  [name] GMW:[id number] DOB: Mar 10, 1981 DOA: 09/29/2023 PCP: Ellender Hose, NP   LOS: 3 days   Brief narrative:  [name] is a 43 y.o. female with medical history significant for depression and ADHD presented to hospital with elevated blood pressure, nausea and vomiting.   EMS was called in and received IV fluids and antiemetics with some improvement but subsequently got worse and then continued to experience nausea and vomiting and fatigue.  In the ED, patient was tachycardic with elevated blood pressure.  Labs showed hypokalemia and leukocytosis.  Respiratory viral panel was negative.  CTA of the chest was negative for PE.  CT scan of the abdomen pelvis showed diffuse  hypodense splenic lesions, some with rim enhancement.  Urinalysis showed protein with 11-20 white cells.  No nitrites.  Lactate was 1.2.  D-dimer was elevated more than 2 so CT angiogram of the chest was performed which showed no evidence of pulmonary embolism but apical emphysema with mild bronchitis and hypodense lesions in the spleen some swelling remained has been.  Pregnancy test was negative.  Lipase was 25.  Blood cultures were collected and patient was given 2 L of Ringer lactate solution vancomycin and Rocephin and was admitted to the  hospital for further evaluation and treatment.     Assessment/Plan: Principal Problem:   Splenic lesion Active Problems:   Hypertensive urgency   Hypokalemia   Dental abscess  Splenic lesions.  Multiple hypodense lesions with rim enhancement differentials including splenic lymphoma infectious process could be embolic lesions from infective endocarditis.  Patient noted to have murmur but patient saying that she had it from childhood.  Continue empiric antibiotic with vancomycin and Unasyn.  Blood cultures negative in 3 days.  Urine drug screen positive for benzodiazepines and THC.  D-dimer was elevated.  CT angiogram of the chest however negative for PE.  HIV  nonreactive.  2D echocardiogram shows abnormal septal wall motion but LV ejection fraction of 60 to 65% with grade 1 diastolic dysfunction.  Tribal mitral valve regurgitation and aortic valve regurgitation. Mri of the abdomen with hypodense lesions possibility infective in etiology.  Infectious disease was consulted and unable to rule out infective endocarditis with embolic phenomena.  Plan for TEE today.  Nausea vomiting.  Resolved   Hypertensive urgency  Not on antihypertensives at home.  Likely new onset hypertension.  Was started on metoprolol while in the hospital twice a day.  Still quite elevated, will add a second antihypertensive at this time.  Will seems to be slightly anxious too.  Hypokalemia  Improved after repletion.  Latest potassium of 4.1.  Mild leukocytosis.  Initial WBC at 13.5.  WBC has trended down to 9.2 today.   Dental abscess  Status post incision and drainage, Improving.    Patient stated that she had been having dental problems for the last 2 years and a that she was told to have root canal treatment but still has not had a chance.  Has a dental appointment as outpatient.  Heartburn indigestion.  on Protonix and maalox.  Denies further complaints.  Cannabis use.  Counseled about it.  No nausea vomiting.  DVT prophylaxis: enoxaparin (LOVENOX) injection 40 mg Start: 09/30/23 1000   Disposition: Likely home in 1 to 2 days when okay with ID.  Status is: Inpatient  Remains inpatient appropriate because: Pending clinical improvement, further workup, IV antibiotic,  TEE    Code Status:     Code Status: Full Code  Family Communication: ,  spoke with the patient's mother on the phone on 10/02/2023.  Consultants: Infectious disease Cardiology for TEE  Procedures: 2D echocardiogram. Plan for TEE 10/03/2023  Anti-infectives:  Unasyn and vancomycin IV.  Anti-infectives (From admission, onward)    Start     Dose/Rate Route Frequency Ordered Stop   10/02/23  2200  [MAR Hold]  Vancomycin (VANCOCIN) 1,250 mg in sodium chloride 0.9 % 250 mL IVPB        (MAR Hold since Tue 10/03/2023 at 0931.Hold Reason: Transfer to a Procedural area)  Note to Pharmacy: Indication: Endocarditis   1,250 mg 166.7 mL/hr over 90 Minutes Intravenous Daily at bedtime 10/02/23 1231     10/02/23 1200  [MAR Hold]  Ampicillin-Sulbactam (UNASYN) 3 g in sodium chloride 0.9 % 100 mL IVPB        (MAR Hold since Tue 10/03/2023 at 0931.Hold Reason: Transfer to a Procedural area)   3 g 200 mL/hr over 30 Minutes Intravenous Every 6 hours 10/02/23 1035     09/30/23 2200  cefTRIAXone (ROCEPHIN) 2 g in sodium chloride 0.9 % 100 mL IVPB  Status:  Discontinued        2 g 200 mL/hr over 30 Minutes Intravenous Every 24 hours 09/30/23 0326 10/02/23 1035   09/30/23 2200  vancomycin (VANCOREADY) IVPB 1250 mg/250 mL  Status:  Discontinued        1,250 mg 166.7 mL/hr over 90 Minutes Intravenous Every 24 hours 09/30/23 0358 10/02/23 1231   09/30/23 0300  vancomycin (VANCOCIN) IVPB 1000 mg/200 mL premix        1,000 mg 200 mL/hr over 60 Minutes Intravenous  Once 09/30/23 0246 09/30/23 0530   09/29/23 2200  cefTRIAXone (ROCEPHIN) 2 g in sodium chloride 0.9 % 100 mL IVPB        2 g 200 mL/hr over 30 Minutes Intravenous  Once 09/29/23 2153 09/29/23 2237       Subjective: Today, patient was seen and examined at bedside.  Patient is anxious about the procedure of going through TEE.  Could not sleep well.  Denies any nausea vomiting fever chills or rigor.   Objective: Vitals:   10/03/23 0951 10/03/23 1000  BP: (!) 164/108 (!) 172/110  Pulse: 83 92  Resp: (!) 21 11  Temp:    SpO2: 100% 99%    Intake/Output Summary (Last 24 hours) at 10/03/2023 1113 Last data filed at 10/03/2023 1110 Gross per 24 hour  Intake 790 ml  Output 0 ml  Net 790 ml   Filed Weights   09/29/23 1841 09/30/23 0456  Weight: 59 kg 64.2 kg   Body mass index is 22.16 kg/m.   Physical Exam:  GENERAL: Patient is alert  awake and oriented. Not in obvious distress.  Mildly anxious. HENT: No scleral pallor or icterus. Pupils equally reactive to light. Oral mucosa is moist NECK: is supple, no gross swelling noted. CHEST: Clear to auscultation. No crackles or wheezes.  CVS: S1 and S2 heard, faint systolic murmur.  Regular rate and rhythm.  ABDOMEN: Soft, non-tender, bowel sounds are present. EXTREMITIES: No edema. CNS: Cranial nerves are intact. No focal motor deficits. SKIN: warm and dry without rashes.  Data Review: I have personally reviewed the following laboratory data and studies,  CBC: Recent Labs  Lab 09/29/23 1921 09/30/23 0759 10/01/23 1249 10/02/23 1013 10/03/23 0315  WBC 13.5* 14.8* 13.3* 11.1* 9.2  HGB 16.0* 13.7 13.5 13.5 11.4*  HCT 43.1 36.7 37.3 37.0 31.5*  MCV 84.5 83.2 85.0 85.6 86.8  PLT 363 298 438* 501* 407*   Basic Metabolic Panel: Recent Labs  Lab 09/29/23 1921 09/30/23 0759 10/01/23 1249 10/02/23 1013 10/03/23 0315  NA 135 136 138 141 139  K 3.4* 3.4* 3.5 3.2* 4.1  CL 103 105 108 107 111  CO2 21* 20* 21* 21* 19*  GLUCOSE 117* 103* 110* 129* 95  BUN 25* 15 12 9 9   CREATININE 1.01* 0.98 0.88 1.27* 1.04*  CALCIUM 9.8 9.0 9.0 9.5 8.6*  MG  --  1.8 1.9  --   --    Liver Function Tests: Recent Labs  Lab 09/29/23 1921 10/01/23 1249  AST 13* 23  ALT 17 23  ALKPHOS 58 46  BILITOT 0.6 0.5  PROT 8.6* 7.0  ALBUMIN 4.6 3.3*   Recent Labs  Lab 09/29/23 1921  LIPASE 25   No results for input(s): "AMMONIA" in the last 168 hours. Cardiac Enzymes: No results for input(s): "CKTOTAL", "CKMB", "CKMBINDEX", "TROPONINI" in the last 168 hours. BNP (last 3 results) No results for input(s): "BNP" in the last 8760 hours.  ProBNP (last 3 results) No results for input(s): "PROBNP" in the last 8760 hours.  CBG: No results for input(s): "GLUCAP" in the last 168 hours. Recent Results (from the past 240 hours)  Blood culture (routine x 2)     Status: None (Preliminary  result)   Collection Time: 09/29/23  9:32 PM   Specimen: BLOOD  Result Value Ref Range Status   Specimen Description   Final    BLOOD UNKNOWN Performed at [facility], [address], [place], [place] [address]    Special Requests   Final    BOTTLES DRAWN AEROBIC AND ANAEROBIC Blood Culture adequate volume Performed at [facility], [address], [place], [place] [address]    Culture   Final    NO GROWTH 3 DAYS Performed at [facility], [address], [place], [place] [address]    Report Status PENDING  Incomplete  Resp panel by RT-PCR (RSV, Flu A&B, Covid) Urine, Clean Catch     Status: None   Collection Time: 09/29/23  9:33 PM   Specimen: Urine, Clean Catch; Nasal Swab  Result Value Ref Range Status   SARS Coronavirus 2 by RT PCR NEGATIVE NEGATIVE Final    Comment: (NOTE) SARS-CoV-2 target nucleic acids are NOT DETECTED.  The SARS-CoV-2 RNA is generally detectable in upper respiratory specimens during the acute phase of infection. The lowest concentration of SARS-CoV-2 viral copies this assay can detect is 138 copies/mL. A negative result does not preclude SARS-Cov-2 infection and should not be used as the sole basis for treatment or other patient management decisions. A negative result may occur with  improper specimen collection/handling, submission of specimen other than nasopharyngeal swab, presence of viral mutation(s) within the areas targeted by this assay, and inadequate number of viral copies(<138 copies/mL). A negative result must be combined with clinical observations, patient history, and epidemiological information. The expected result is Negative.  Fact Sheet for Patients:  BloggerCourse.com  Fact Sheet for Healthcare Providers:  SeriousBroker.it  This test is no t yet approved or cleared by the [place] FDA and  has been authorized for detection and/or  diagnosis of SARS-CoV-2 by FDA under an Emergency Use Authorization (EUA). This EUA will remain  in effect (meaning this test can be used) for the duration of the COVID-19 declaration under Section 564(b)(1) of the Act, 21 U.S.C.section 360bbb-3(b)(1), unless the authorization is terminated  or revoked  sooner.       Influenza A by PCR NEGATIVE NEGATIVE Final   Influenza B by PCR NEGATIVE NEGATIVE Final    Comment: (NOTE) The Xpert Xpress SARS-CoV-2/FLU/RSV plus assay is intended as an aid in the diagnosis of influenza from Nasopharyngeal swab specimens and should not be used as a sole basis for treatment. Nasal washings and aspirates are unacceptable for Xpert Xpress SARS-CoV-2/FLU/RSV testing.  Fact Sheet for Patients: BloggerCourse.com  Fact Sheet for Healthcare Providers: SeriousBroker.it  This test is not yet approved or cleared by the [place] FDA and has been authorized for detection and/or diagnosis of SARS-CoV-2 by FDA under an Emergency Use Authorization (EUA). This EUA will remain in effect (meaning this test can be used) for the duration of the COVID-19 declaration under Section 564(b)(1) of the Act, 21 U.S.C. section 360bbb-3(b)(1), unless the authorization is terminated or revoked.     Resp Syncytial Virus by PCR NEGATIVE NEGATIVE Final    Comment: (NOTE) Fact Sheet for Patients: BloggerCourse.com  Fact Sheet for Healthcare Providers: SeriousBroker.it  This test is not yet approved or cleared by the [place] FDA and has been authorized for detection and/or diagnosis of SARS-CoV-2 by FDA under an Emergency Use Authorization (EUA). This EUA will remain in effect (meaning this test can be used) for the duration of the COVID-19 declaration under Section 564(b)(1) of the Act, 21 U.S.C. section 360bbb-3(b)(1), unless the authorization is terminated  or revoked.  Performed at [facility], [address] [place], [place] [address]   Culture, blood (routine x 2)     Status: None (Preliminary result)   Collection Time: 09/29/23  9:50 PM   Specimen: BLOOD RIGHT ARM  Result Value Ref Range Status   Specimen Description BLOOD RIGHT ARM  Final   Special Requests   Final    BOTTLES DRAWN AEROBIC AND ANAEROBIC Blood Culture adequate volume   Culture   Final    NO GROWTH 3 DAYS Performed at [facility], [address] [place], [place] [address]    Report Status PENDING  Incomplete  MRSA Next Gen by PCR, Nasal     Status: None   Collection Time: 10/02/23 10:36 AM   Specimen: Nasal Mucosa; Nasal Swab  Result Value Ref Range Status   MRSA by PCR Next Gen NOT DETECTED NOT DETECTED Final    Comment: (NOTE) The GeneXpert MRSA Assay (FDA approved for NASAL specimens only), is one component of a comprehensive MRSA colonization surveillance program. It is not intended to diagnose MRSA infection nor to guide or monitor treatment for MRSA infections. Test performance is not FDA approved in patients less than 53 years old. Performed at [facility], [address], [place], [place] [address]      Studies: DG Orthopantogram Result Date: 10/03/2023 CLINICAL DATA:  Infection EXAM: ORTHOPANTOGRAM/PANORAMIC COMPARISON:  None Available. FINDINGS: The midline dentition is out of plane and out of focus. Dental caries are noted involving tooth 16, with near complete erosion of the crown and tooth 15 with partial erosion of the crown. There is, additionally, periapical lucency in keeping with superimposed periodontal disease. Tooth # 17, # 32 are unerupted. No mandibular fracture, dislocation, or focal lesion. IMPRESSION: 1. Dental caries and periodontal disease involving tooth # 16 and tooth # 15. Electronically Signed   By: Helyn Numbers M.D.   On: 10/03/2023 02:45      Joycelyn Das, MD  [facility] 10/03/2023  If 7PM-7AM, please contact  night-coverage

## 2023-10-03 NOTE — Anesthesia Postprocedure Evaluation (Signed)
 Anesthesia Post Note  Patient: [name]  Procedure(s) Performed: TRANSESOPHAGEAL ECHOCARDIOGRAM     Patient location during evaluation: PACU Anesthesia Type: MAC Level of consciousness: awake and alert Pain management: pain level controlled Vital Signs Assessment: post-procedure vital signs reviewed and stable Respiratory status: spontaneous breathing, nonlabored ventilation, respiratory function stable and patient connected to nasal cannula oxygen Cardiovascular status: stable and blood pressure returned to baseline Postop Assessment: no apparent nausea or vomiting Anesthetic complications: no   No notable events documented.  Last Vitals:  Vitals:   10/03/23 1125 10/03/23 1220  BP:  (!) 164/112  Pulse:  76  Resp:  16  Temp: 36.7 C 36.9 C  SpO2:  100%    Last Pain:  Vitals:   10/03/23 1220  TempSrc: Oral  PainSc:                  Mariann Barter

## 2023-10-03 NOTE — CV Procedure (Signed)
 TEE  IIndication:  R/O endocarditis  Patient sedated by anesthesia with propofol intravenously     Bite guard placed  TEE probe advanced to mid esophagus without difficulty  No vegetations  TV normal  Trace TR MV normal  Trace MR AV normal  NO AI PV normal   MIld PI  LA, LAA without masses.  LVEF and RVEF normal  Small PFO present as tested with injection of agitated saline with bubbles seen in LA  Thoracic aorta is normal  Procedure without complications  Full report to follow in CV section of chart  Dietrich Pates MD

## 2023-10-03 NOTE — Interval H&P Note (Signed)
 History and Physical Interval Note:  10/03/2023 9:54 AM  [name]  has presented today for surgery, with the diagnosis of Bacteremia.  The various methods of treatment have been discussed with the patient and family. After consideration of risks, benefits and other options for treatment, the patient has consented to  Procedure(s): TRANSESOPHAGEAL ECHOCARDIOGRAM (N/A) as a surgical intervention.  The patient's history has been reviewed, patient examined, no change in status, stable for surgery.  I have reviewed the patient's chart and labs.  Questions were answered to the patient's satisfaction.     Dietrich Pates

## 2023-10-03 NOTE — Transfer of Care (Signed)
 Immediate Anesthesia Transfer of Care Note  Patient: [name]  Procedure(s) Performed: TRANSESOPHAGEAL ECHOCARDIOGRAM  Patient Location: Cath Lab  Anesthesia Type:MAC  Level of Consciousness: awake, alert , and oriented  Airway & Oxygen Therapy: Patient Spontanous Breathing and Patient connected to nasal cannula oxygen  Post-op Assessment: Report given to RN and Post -op Vital signs reviewed and stable  Post vital signs: Reviewed and stable  Last Vitals:  Vitals Value Taken Time  BP    Temp    Pulse    Resp    SpO2      Last Pain:  Vitals:   10/03/23 0945  TempSrc:   PainSc: 0-No pain         Complications: No notable events documented.

## 2023-10-03 NOTE — Plan of Care (Signed)
  Problem: Clinical Measurements: Goal: Ability to maintain clinical measurements within normal limits will improve Outcome: Completed/Met

## 2023-10-03 NOTE — TOC Progression Note (Signed)
 Transition of Care [facility]) - Progression Note    Patient Details  Name: [name] MRN: [id number] Date of Birth: Feb 14, 1981  Transition of Care [facility]) CM/SW Contact  Tom-Johnson, Hershal Coria, RN Phone Number: 10/03/2023, 3:14 PM  Clinical Narrative:     Patient underwent TEE today 10/03/23 to r/o Endocarditis. Cardiology following.  CM will continue to follow.           Expected Discharge Plan and Services                                               Social Determinants of Health (SDOH) Interventions SDOH Screenings   Food Insecurity: No Food Insecurity (09/30/2023)  Housing: Low Risk  (09/30/2023)  Transportation Needs: No Transportation Needs (09/30/2023)  Utilities: Not At Risk (09/30/2023)  Social Connections: Unknown (05/10/2022)   Received from [facility]  Tobacco Use: Medium Risk (09/30/2023)    Readmission Risk Interventions    10/02/2023    3:58 PM  Readmission Risk Prevention Plan  Post Dischage Appt Complete  Medication Screening Complete  Transportation Screening Complete

## 2023-10-03 NOTE — Plan of Care (Signed)
   Problem: Activity: Goal: Risk for activity intolerance will decrease Outcome: Progressing   Problem: Nutrition: Goal: Adequate nutrition will be maintained Outcome: Progressing   Problem: Coping: Goal: Level of anxiety will decrease Outcome: Progressing   Problem: Elimination: Goal: Will not experience complications related to bowel motility Outcome: Progressing

## 2023-10-04 ENCOUNTER — Encounter (HOSPITAL_COMMUNITY): Payer: Self-pay | Admitting: Internal Medicine

## 2023-10-04 DIAGNOSIS — D7389 Other diseases of spleen: Secondary | ICD-10-CM | POA: Diagnosis not present

## 2023-10-04 LAB — BASIC METABOLIC PANEL
Anion gap: 6 (ref 5–15)
BUN: 6 mg/dL (ref 6–20)
CO2: 25 mmol/L (ref 22–32)
Calcium: 8.6 mg/dL — ABNORMAL LOW (ref 8.9–10.3)
Chloride: 107 mmol/L (ref 98–111)
Creatinine, Ser: 1.12 mg/dL — ABNORMAL HIGH (ref 0.44–1.00)
GFR, Estimated: >60 mL/min (ref >60)
Glucose, Bld: 87 mg/dL (ref 70–99)
Potassium: 3.6 mmol/L (ref 3.5–5.1)
Sodium: 138 mmol/L (ref 135–145)

## 2023-10-04 MED ORDER — NIFEDIPINE ER OSMOTIC RELEASE 60 MG PO TB24
60.0000 mg | ORAL_TABLET | Freq: Every day | ORAL | Status: DC
Start: 2023-10-05 — End: 2023-10-05
  Administered 2023-10-05: 60 mg via ORAL
  Filled 2023-10-04: qty 1

## 2023-10-04 MED ORDER — ENOXAPARIN SODIUM 40 MG/0.4ML IJ SOSY
40.0000 mg | PREFILLED_SYRINGE | INTRAMUSCULAR | Status: DC
  Administered 2023-10-04: 40 mg via SUBCUTANEOUS
  Filled 2023-10-04: qty 0.4

## 2023-10-04 MED ORDER — AMOXICILLIN-POT CLAVULANATE 875-125 MG PO TABS
1.0000 | ORAL_TABLET | Freq: Two times a day (BID) | ORAL | Status: DC
  Administered 2023-10-04 – 2023-10-05 (×2): 1 via ORAL
  Filled 2023-10-04 (×2): qty 1

## 2023-10-04 MED ORDER — METOPROLOL TARTRATE 25 MG PO TABS: 25.0000 mg | ORAL_TABLET | Freq: Three times a day (TID) | ORAL | Status: DC

## 2023-10-04 MED ORDER — NIFEDIPINE ER OSMOTIC RELEASE 30 MG PO TB24
30.0000 mg | ORAL_TABLET | Freq: Every day | ORAL | Status: DC
  Administered 2023-10-04: 30 mg via ORAL
  Filled 2023-10-04: qty 1

## 2023-10-04 NOTE — Progress Notes (Signed)
 [name]  ZOX:[id number] DOB: 29-Jan-1981 DOA: 09/29/2023 PCP: Ellender Hose, NP    Brief Narrative:  43 year old with a history of ADHD and depression who presented to the ER 3/7 with nausea vomiting and severe fatigue.  At presentation she was noted to be tachycardic with an elevated blood pressure.  CTa chest was negative for PE and CT abdomen/pelvis noted hypodense splenic lesions with rim enhancement.  Goals of Care:   Code Status: Full Code   DVT prophylaxis: enoxaparin (LOVENOX) injection 40 mg Start: 09/30/23 1000   Interim Hx: Afebrile since admission.  Blood pressure now controlled.  Vital stable.  The patient is understandably anxious concerning her medical issues overall however she is feeling better.  She has no new complaints today.    Assessment & Plan:  Splenic lesions Noted incidentally on CT abdomen - differential includes splenic lymphoma versus infection - currently on empiric antibiotic therapy - blood cultures NGTD thus far - MRI abdomen confirms multiple hypodense lesions of the spleen - TEE 3/11 revealed no vegetations and an incidentally noted small PFO - discussed with IR and lesions are too small to allow safe needle aspiration - ID suggesting obtaining Oncology opinion   Intractable nausea and vomiting Resolved  Hypertensive urgency No prior history of HTN -medical therapy initiated during this hospital stay -likely previously undiagnosed HTN exacerbated by acute stress of this admission  Hypokalemia Due to poor intake/GI loss -resolved with supplementation  Dental abscess Patient reports 2 years of nagging dental problems with prior recommendation for root canal -required I&D earlier this month and was treated with Augmentin -orthopantogram this admission notes dental caries with periodontal disease involving tooth 15 and 16   Family Communication: Spoke with mother at length via telephone Disposition: Anticipate eventual discharge  home   Objective: Blood pressure 134/79, pulse 80, temperature 98.7 F (37.1 C), resp. rate 19, height 5\' 7"  (1.702 m), weight 64.2 kg, SpO2 100%, unknown if currently breastfeeding.  Intake/Output Summary (Last 24 hours) at 10/04/2023 0927 Last data filed at 10/04/2023 0810 Gross per 24 hour  Intake 1330.09 ml  Output 1 ml  Net 1329.09 ml   Filed Weights   09/29/23 1841 09/30/23 0456  Weight: 59 kg 64.2 kg    Examination: General: No acute respiratory distress Lungs: Clear to auscultation bilaterally without wheezes or crackles Cardiovascular: Regular rate and rhythm without murmur gallop or rub normal S1 and S2 Abdomen: Nontender, nondistended, soft, bowel sounds positive, no rebound, no ascites, no appreciable mass Extremities: No significant cyanosis, clubbing, or edema bilateral lower extremities  CBC: Recent Labs  Lab 10/01/23 1249 10/02/23 1013 10/03/23 0315  WBC 13.3* 11.1* 9.2  HGB 13.5 13.5 11.4*  HCT 37.3 37.0 31.5*  MCV 85.0 85.6 86.8  PLT 438* 501* 407*   Basic Metabolic Panel: Recent Labs  Lab 09/30/23 0759 10/01/23 1249 10/02/23 1013 10/03/23 0315  NA 136 138 141 139  K 3.4* 3.5 3.2* 4.1  CL 105 108 107 111  CO2 20* 21* 21* 19*  GLUCOSE 103* 110* 129* 95  BUN 15 12 9 9   CREATININE 0.98 0.88 1.27* 1.04*  CALCIUM 9.0 9.0 9.5 8.6*  MG 1.8 1.9  --   --    GFR: Estimated Creatinine Clearance: 68.5 mL/min (A) (by C-G formula based on SCr of 1.04 mg/dL (H)).   Scheduled Meds:  amLODipine  5 mg Oral Daily   enoxaparin (LOVENOX) injection  40 mg Subcutaneous Q24H   feeding supplement  1 Container Oral TID  BM   metoprolol tartrate  25 mg Oral BID   pantoprazole  40 mg Oral Daily   sodium chloride flush  10-40 mL Intracatheter Q12H   sodium chloride flush  3 mL Intravenous Q12H   Continuous Infusions:  ampicillin-sulbactam (UNASYN) IV 3 g (10/04/23 0912)   Vancomycin (VANCOCIN) 1,250 mg in sodium chloride 0.9 % 250 mL IVPB 1,250 mg (10/03/23  2140)     LOS: 4 days   Lonia Blood, MD [facility] Office  [phone] Pager - Text Page per Loretha Stapler  If 7PM-7AM, please contact night-coverage per Amion 10/04/2023, 9:27 AM

## 2023-10-04 NOTE — Plan of Care (Signed)
   Problem: Clinical Measurements: Goal: Will remain free from infection Outcome: Progressing   Problem: Pain Managment: Goal: General experience of comfort will improve and/or be controlled Outcome: Progressing   Problem: Safety: Goal: Ability to remain free from injury will improve Outcome: Progressing   Problem: Skin Integrity: Goal: Risk for impaired skin integrity will decrease Outcome: Progressing

## 2023-10-04 NOTE — Progress Notes (Signed)
 [facility]  Date of Admission:  09/29/2023   Total days of inpatient antibiotics 5  Principal Problem:   Splenic lesion Active Problems:   Hypertensive urgency   Hypokalemia   Dental abscess          Assessment: 43 year old female with history of dental abscess requiring debridement in the beginning of March placed on Augmentin presented with nausea and vomiting found to have: #Splenic lesions #History of dental abscess on Augmentin - On arrival she had WBC 13K.  CT abdomen pelvis showed diffuse innumerable hypodense lesion in spleen with differential including metastatic disease, infectious process, lymphoma versus glaucomatous disease. - MRI innumerable cystic nodules. - Blood cultures from admission are negative.  TTE no vegetation.  ID engaged.  TEE no vegetation. Blood Cx remain negative.  Patient vancomycin and ceftriaxone to Unasyn.  MRSA PCR negative - IR engaged for possible aspiration of splenic abscesses.  Imaging reviewed, deemed risk of hemorrhage too high to aspirate.  No IR procedure planned    Recommendations:  -D/C unasyn -Start Augmentin, anticipate 4-6 weeks for empiric endovascualr involvement. Ill see pt on 4/3 (4 weeks of treatment) - Follow-up blood cultures - Follow-up with dentistry outpatient - Engage hematology/oncology to review possible noninfectious/malignant etiology if blood cultures remain negative.   Evaluation of this patient requires complex antimicrobial therapy evaluation and counseling + isolation needs for disease transmission risk assessment and mitigation    Microbiology:   Antibiotics: Vancomycin 3/7- Ceftriaxone 3/7-3/9 Unasyn/10-present   Cultures: Blood 3/7 no growth MRSA PCR negative  SUBJECTIVE: In bed.  Discussed plan. Interval: Febrile overnight.  Review of Systems: Review of Systems  All other systems reviewed and are negative.    Scheduled Meds:  enoxaparin (LOVENOX)  injection  40 mg Subcutaneous Q24H   feeding supplement  1 Container Oral TID BM   metoprolol tartrate  25 mg Oral BID   NIFEdipine  30 mg Oral Daily   pantoprazole  40 mg Oral Daily   sodium chloride flush  10-40 mL Intracatheter Q12H   sodium chloride flush  3 mL Intravenous Q12H   Continuous Infusions:  ampicillin-sulbactam (UNASYN) IV 3 g (10/04/23 0912)   PRN Meds:.acetaminophen **OR** [DISCONTINUED] acetaminophen, alum & mag hydroxide-simeth, hydrALAZINE, ondansetron **OR** ondansetron (ZOFRAN) IV, mouth rinse, polyethylene glycol, sodium chloride flush No Known Allergies  OBJECTIVE: Vitals:   10/03/23 2129 10/04/23 0529 10/04/23 0749 10/04/23 1231  BP: (!) 143/97 (!) 127/99 134/79 (!) 187/113  Pulse: 92 81 80 72  Resp: 18 18 19    Temp: 98.3 F (36.8 C) 98.7 F (37.1 C)    TempSrc: Oral     SpO2: 98% 100% 100%   Weight:      Height:       Body mass index is 22.16 kg/m.  Physical Exam Constitutional:      Appearance: Normal appearance.  HENT:     Head: Normocephalic and atraumatic.     Right Ear: Tympanic membrane normal.     Left Ear: Tympanic membrane normal.     Nose: Nose normal.     Mouth/Throat:     Mouth: Mucous membranes are moist.  Eyes:     Extraocular Movements: Extraocular movements intact.     Conjunctiva/sclera: Conjunctivae normal.     Pupils: Pupils are equal, round, and reactive to light.  Cardiovascular:     Rate and Rhythm: Normal rate and regular rhythm.     Heart sounds: No murmur heard.  No friction rub. No gallop.  Pulmonary:     Effort: Pulmonary effort is normal.     Breath sounds: Normal breath sounds.  Abdominal:     General: Abdomen is flat.     Palpations: Abdomen is soft.  Musculoskeletal:        General: Normal range of motion.  Skin:    General: Skin is warm and dry.  Neurological:     General: No focal deficit present.     Mental Status: She is alert and oriented to person, place, and time.  Psychiatric:         Mood and Affect: Mood normal.       Lab Results Lab Results  Component Value Date   WBC 9.2 10/03/2023   HGB 11.4 (L) 10/03/2023   HCT 31.5 (L) 10/03/2023   MCV 86.8 10/03/2023   PLT 407 (H) 10/03/2023    Lab Results  Component Value Date   CREATININE 1.12 (H) 10/04/2023   BUN 6 10/04/2023   NA 138 10/04/2023   K 3.6 10/04/2023   CL 107 10/04/2023   CO2 25 10/04/2023    Lab Results  Component Value Date   ALT 23 10/01/2023   AST 23 10/01/2023   ALKPHOS 46 10/01/2023   BILITOT 0.5 10/01/2023        Danelle Earthly, MD [facility] 10/04/2023, 2:12 PM

## 2023-10-04 NOTE — Progress Notes (Signed)
 Interventional Radiology asked to evaluate patient for possible aspiration of the splenic lesions identified on recent imaging. Imaging reviewed by Dr. Elby Showers who deems the risk of hemorrhage too high to try and aspirate any of these small lesions.   No IR procedure planned. Primary team made aware via Epic Chat.  Alwyn Ren, AGACNP-BC 10/04/2023, 11:56 AM

## 2023-10-05 ENCOUNTER — Encounter: Payer: Self-pay | Admitting: Medical Oncology

## 2023-10-05 DIAGNOSIS — D7389 Other diseases of spleen: Secondary | ICD-10-CM | POA: Diagnosis not present

## 2023-10-05 LAB — COMPREHENSIVE METABOLIC PANEL
ALT: 23 U/L (ref 0–44)
AST: 24 U/L (ref 15–41)
Albumin: 3.4 g/dL — ABNORMAL LOW (ref 3.5–5.0)
Alkaline Phosphatase: 50 U/L (ref 38–126)
Anion gap: 9 (ref 5–15)
BUN: 6 mg/dL (ref 6–20)
CO2: 25 mmol/L (ref 22–32)
Calcium: 9.3 mg/dL (ref 8.9–10.3)
Chloride: 105 mmol/L (ref 98–111)
Creatinine, Ser: 0.79 mg/dL (ref 0.44–1.00)
GFR, Estimated: >60 mL/min (ref >60)
Glucose, Bld: 160 mg/dL — ABNORMAL HIGH (ref 70–99)
Potassium: 3.5 mmol/L (ref 3.5–5.1)
Sodium: 139 mmol/L (ref 135–145)
Total Bilirubin: 0.3 mg/dL (ref 0.0–1.2)
Total Protein: 7.1 g/dL (ref 6.5–8.1)

## 2023-10-05 LAB — CBC
HCT: 34.9 % — ABNORMAL LOW (ref 36.0–46.0)
Hemoglobin: 12.5 g/dL (ref 12.0–15.0)
MCH: 30.9 pg (ref 26.0–34.0)
MCHC: 35.8 g/dL (ref 30.0–36.0)
MCV: 86.4 fL (ref 80.0–100.0)
Platelets: 525 10*3/uL — ABNORMAL HIGH (ref 150–400)
RBC: 4.04 MIL/uL (ref 3.87–5.11)
RDW: 12.9 % (ref 11.5–15.5)
WBC: 11.3 10*3/uL — ABNORMAL HIGH (ref 4.0–10.5)
nRBC: 0 % (ref 0.0–0.2)

## 2023-10-05 LAB — CULTURE, BLOOD (ROUTINE X 2)
Culture: NO GROWTH
Special Requests: ADEQUATE
Special Requests: ADEQUATE

## 2023-10-05 LAB — LACTATE DEHYDROGENASE: LDH: 182 U/L (ref 98–192)

## 2023-10-05 LAB — MAGNESIUM: Magnesium: 1.6 mg/dL — ABNORMAL LOW (ref 1.7–2.4)

## 2023-10-05 MED ORDER — NIFEDIPINE ER 60 MG PO TB24: 60.0000 mg | ORAL_TABLET | Freq: Every day | ORAL | 2 refills | Status: DC

## 2023-10-05 MED ORDER — METOPROLOL TARTRATE 50 MG PO TABS: 50.0000 mg | ORAL_TABLET | Freq: Two times a day (BID) | ORAL | 2 refills | Status: DC

## 2023-10-05 MED ORDER — METOPROLOL TARTRATE 50 MG PO TABS
50.0000 mg | ORAL_TABLET | Freq: Two times a day (BID) | ORAL | Status: DC
  Administered 2023-10-05: 50 mg via ORAL
  Filled 2023-10-05: qty 1

## 2023-10-05 MED ORDER — AMOXICILLIN-POT CLAVULANATE 875-125 MG PO TABS: 1.0000 | ORAL_TABLET | Freq: Two times a day (BID) | ORAL | 0 refills | Status: DC

## 2023-10-05 NOTE — Progress Notes (Signed)
 [facility]  Date of Admission:  09/29/2023   Total days of inpatient antibiotics 5  Principal Problem:   Splenic lesion Active Problems:   Hypertensive urgency   Hypokalemia   Dental abscess          Assessment: 43 year old female with history of dental abscess requiring debridement in the beginning of March placed on Augmentin presented with nausea and vomiting found to have: #Splenic lesions #History of dental abscess on Augmentin - On arrival she had WBC 13K.  CT abdomen pelvis showed diffuse innumerable hypodense lesion in spleen with differential including metastatic disease, infectious process, lymphoma versus glaucomatous disease. - MRI innumerable cystic nodules. - Blood cultures from admission are negative.  TTE no vegetation.  ID engaged.  TEE no vegetation. Blood Cx remain negative.  Patient vancomycin and ceftriaxone to Unasyn.  MRSA PCR negative - IR engaged for possible aspiration of splenic abscesses.  Imaging reviewed, deemed risk of hemorrhage too high to aspirate.  No IR procedure planned    Recommendations:  - Continue Augmentin, anticipate 4-6 weeks for empiric endovascualr involvement. Ill see pt on 4/3 (4 weeks of treatment) - Follow-up with dentistry outpatient(next week) - Per primary discussed with Dr. Leonides Schanz, plan on outpatient spleenectomy and pending results consider splenectomy   Evaluation of this patient requires complex antimicrobial therapy evaluation and counseling + isolation needs for disease transmission risk assessment and mitigation    Microbiology:   Antibiotics: Vancomycin 3/7- Ceftriaxone 3/7-3/9 Unasyn/10-present   Cultures: Blood 3/7 no growth MRSA PCR negative  SUBJECTIVE: In bed.  Discussed plan as above.  Interval: Afebrile overnight.  Review of Systems: Review of Systems  All other systems reviewed and are negative.    Scheduled Meds:  amoxicillin-clavulanate  1 tablet Oral Q12H    enoxaparin (LOVENOX) injection  40 mg Subcutaneous Q24H   feeding supplement  1 Container Oral TID BM   metoprolol tartrate  50 mg Oral BID   NIFEdipine  60 mg Oral Daily   pantoprazole  40 mg Oral Daily   sodium chloride flush  10-40 mL Intracatheter Q12H   sodium chloride flush  3 mL Intravenous Q12H   Continuous Infusions:   PRN Meds:.acetaminophen **OR** [DISCONTINUED] acetaminophen, alum & mag hydroxide-simeth, hydrALAZINE, ondansetron **OR** ondansetron (ZOFRAN) IV, mouth rinse, polyethylene glycol, sodium chloride flush No Known Allergies  OBJECTIVE: Vitals:   10/04/23 2111 10/04/23 2127 10/05/23 0618 10/05/23 0739  BP: 127/88 127/88 (!) 145/94 (!) 152/102  Pulse: 98 98 80 80  Resp:    19  Temp: 98.4 F (36.9 C)  98.3 F (36.8 C) 98.4 F (36.9 C)  TempSrc: Oral  Oral Oral  SpO2: 98%  100% 99%  Weight:      Height:       Body mass index is 22.16 kg/m.  Physical Exam Constitutional:      Appearance: Normal appearance.  HENT:     Head: Normocephalic and atraumatic.     Right Ear: Tympanic membrane normal.     Left Ear: Tympanic membrane normal.     Nose: Nose normal.     Mouth/Throat:     Mouth: Mucous membranes are moist.  Eyes:     Extraocular Movements: Extraocular movements intact.     Conjunctiva/sclera: Conjunctivae normal.     Pupils: Pupils are equal, round, and reactive to light.  Cardiovascular:     Rate and Rhythm: Normal rate and regular rhythm.     Heart  sounds: No murmur heard.    No friction rub. No gallop.  Pulmonary:     Effort: Pulmonary effort is normal.     Breath sounds: Normal breath sounds.  Abdominal:     General: Abdomen is flat.     Palpations: Abdomen is soft.  Musculoskeletal:        General: Normal range of motion.  Skin:    General: Skin is warm and dry.  Neurological:     General: No focal deficit present.     Mental Status: She is alert and oriented to person, place, and time.  Psychiatric:        Mood and Affect:  Mood normal.       Lab Results Lab Results  Component Value Date   WBC 11.3 (H) 10/05/2023   HGB 12.5 10/05/2023   HCT 34.9 (L) 10/05/2023   MCV 86.4 10/05/2023   PLT 525 (H) 10/05/2023    Lab Results  Component Value Date   CREATININE 0.79 10/05/2023   BUN 6 10/05/2023   NA 139 10/05/2023   K 3.5 10/05/2023   CL 105 10/05/2023   CO2 25 10/05/2023    Lab Results  Component Value Date   ALT 23 10/05/2023   AST 24 10/05/2023   ALKPHOS 50 10/05/2023   BILITOT 0.3 10/05/2023        Danelle Earthly, MD [facility] 10/05/2023, 1:32 PM

## 2023-10-05 NOTE — TOC Transition Note (Signed)
 Transition of Care [facility]) - Discharge Note   Patient Details  Name: [name] MRN: [id number] Date of Birth: 09-25-1980  [facility] CM/SW Contact:  Tom-Johnson, Hershal Coria, RN Phone Number: 10/05/2023, 1:31 PM   Clinical Narrative:     Patient is scheduled for discharge today.  Readmission Risk Assessment done. Outpatient f/u, hospital f/u and discharge instructions on AVS. No TOC needs or recommendations noted. Family to transport at discharge.  No further TOC needs noted.      Final next level of care: Home/Self Care Barriers to Discharge: Barriers Resolved   Patient Goals and CMS Choice Patient states their goals for this hospitalization and ongoing recovery are:: To returnn home CMS Medicare.gov Compare Post Acute Care list provided to:: Patient Choice offered to / list presented to : NA      Discharge Placement                Patient to be transferred to facility by: Family      Discharge Plan and Services Additional resources added to the After Visit Summary for                  DME Arranged: N/A DME Agency: NA       HH Arranged: NA HH Agency: NA        Social Drivers of Health (SDOH) Interventions SDOH Screenings   Food Insecurity: No Food Insecurity (09/30/2023)  Housing: Low Risk  (09/30/2023)  Transportation Needs: No Transportation Needs (09/30/2023)  Utilities: Not At Risk (09/30/2023)  Social Connections: Unknown (05/10/2022)   Received from [facility]  Tobacco Use: Medium Risk (09/30/2023)     Readmission Risk Interventions    10/02/2023    3:58 PM  Readmission Risk Prevention Plan  Post Dischage Appt Complete  Medication Screening Complete  Transportation Screening Complete

## 2023-10-05 NOTE — Plan of Care (Signed)
   Problem: Elimination: Goal: Will not experience complications related to bowel motility Outcome: Progressing   Problem: Safety: Goal: Ability to remain free from injury will improve Outcome: Progressing   Problem: Skin Integrity: Goal: Risk for impaired skin integrity will decrease Outcome: Progressing

## 2023-10-05 NOTE — Plan of Care (Signed)

## 2023-10-05 NOTE — Progress Notes (Signed)
 [facility]  Outgoing cal: 13:53  LVMOM with patient regarding referral from Dr. Jeanie Sewer. Introduced myself and informed her of reason for call. Inquired with patient availability for appointment for Monday, March 17th @ 12:30. My direct call back number provided to patient.   Gregary Cromer, RN, BSN, [facility] Oncology Nurse Navigator, [facility] 10/05/2023 1:55 PM

## 2023-10-05 NOTE — Discharge Summary (Signed)
 DISCHARGE SUMMARY  [name]  MR#: [id number]  DOB:1981/06/27  Date of Admission: 09/29/2023 Date of Discharge: 10/05/2023  Attending Physician:[name]  Patient's NWG:NFAOZHY, Shari Bible, NP  Disposition: D/C home   Follow-up Appts:  Follow-up Information     Shari Hose, NP Follow up in 1 week(s).   Specialty: Family Medicine Why: You will need a check of your blood pressure in 7-10 days. Contact information: [address] [place] [address] [phone]         Dentist of your choice Follow up.   Why: See the Dentist of your choice next week to have your infected teeth treated, as we discussed.        Shari Standard, Dominguez Follow up.   Specialty: Hematology and Oncology Why: [facility] will contact you to set up a PET scan to evaluate the areas in your spleen. If you do not hear from them by 3/17 call the office at this number. Contact information: [address] [place] [address] [phone]         Shari Earthly, Dominguez Follow up on 10/26/2023.   Specialty: Infectious Diseases Contact information: [address] [place] [address] [phone]                 Tests Needing Follow-up: -PET CT to be arranged at [facility] to evaluate splenic lesions -assure pt has followed up with a Dentist for definitive tx of her dental abscess -assess BP and determine if titration of BP meds is needed  -determine if patient needs to further extend abscence from work - she has been advised to refrain from work until at least Monday, March 24  Discharge Diagnoses: Splenic lesions - infection v/s neoplasm Intractable nausea and vomiting Hypertensive urgency - uncontrolled HTN Hypokalemia Dental abscess  Initial presentation: 43 year old with a history of ADHD, depression, and  recent dental abscess requiring debridement who presented to the ER 3/7 with nausea vomiting and severe  fatigue.  At presentation she was noted to be tachycardic with an elevated blood pressure and WBC of 13K..  CTa chest was negative for PE. CT abdomen/pelvis noted hypodense splenic lesions with rim enhancement.   Hospital Course:  Splenic lesions Noted incidentally on CT abdomen -working differential includes splenic lymphoma versus infection, though I am leaning towards infection -clinically improving on empiric antibiotic therapy - blood cultures no growth for 5 days, perhaps due to abx started prior to this admit - MRI abdomen confirmed multiple hypodense lesions of the spleen, as seen first on CT - TEE 3/11 revealed no vegetations and an incidentally noted small PFO - discussed with IR and lesions are too small to allow safe needle aspiration - discussed with Oncology who is arranging for office consultation with PET CT to evaluate splenic lesions - discussed with patient who understands - clinically she is much improved and stable for d/c home - to complete 6 weeks of abx tx (changed to augmentin prior to d/c) - to f/u w/ ID in outpt setting as noted above, before abx course completed    Intractable nausea and vomiting Resolved -tolerating regular diet at discharge without difficulty   Hypertensive urgency No prior history of HTN -medical therapy initiated during this hospital stay -likely previously undiagnosed HTN exacerbated by acute stress of this admission -further monitoring of blood pressure in the outpatient setting will be indicated with probable need to further titrate medical therapy   Hypokalemia  Due to poor intake/GI loss -resolved with supplementation   Dental abscess Patient reports 2 years of nagging dental problems with prior recommendation for root canal -required I&D earlier this month and was treated with Augmentin -orthopantogram this admission notes dental caries with periodontal disease involving tooth 15 and 16 -patient assures me that she is established with a dentist  and will visit them next week for definitive dental care - I have discussed the potential link of this issue with her splenic issues and the importance of seeing this through to resolution  Allergies as of 10/05/2023   No Known Allergies      Medication List     TAKE these medications    amoxicillin-clavulanate 875-125 MG tablet Commonly known as: AUGMENTIN Take 1 tablet by mouth every 12 (twelve) hours. What changed:  when to take this additional instructions   folic acid 1 MG tablet Commonly known as: FOLVITE Take 1 mg by mouth daily.   metoprolol tartrate 50 MG tablet Commonly known as: LOPRESSOR Take 1 tablet (50 mg total) by mouth 2 (two) times daily. What changed:  medication strength how much to take when to take this   multivitamin tablet Take 1 tablet by mouth daily.   NIFEdipine 60 MG 24 hr tablet Commonly known as: ADALAT CC Take 1 tablet (60 mg total) by mouth daily. Start taking on: October 06, 2023 What changed:  medication strength how much to take when to take this   OVER THE COUNTER MEDICATION Take 1 tablet by mouth daily. Menopause support.   Pain Reliever 325 MG tablet Generic drug: acetaminophen Take 650 mg by mouth every 6 (six) hours as needed.   thiamine 100 MG tablet Commonly known as: VITAMIN B1 Take 100 mg by mouth daily.        Day of Discharge BP (!) 152/102 (BP Location: Left Arm)   Pulse 80   Temp 98.4 F (36.9 C) (Oral)   Resp 19   Ht 5\' 7"  (1.702 m)   Wt 64.2 kg   SpO2 99%   BMI 22.16 kg/m   Physical Exam: General: No acute respiratory distress Lungs: Clear to auscultation bilaterally without wheezes or crackles Cardiovascular: Regular rate and rhythm without murmur gallop or rub normal S1 and S2 Abdomen: Nontender, nondistended, soft, bowel sounds positive, no rebound, no ascites, no appreciable mass Extremities: No significant cyanosis, clubbing, or edema bilateral lower extremities  Basic Metabolic  Panel: Recent Labs  Lab 09/30/23 0759 10/01/23 1249 10/02/23 1013 10/03/23 0315 10/04/23 1050 10/05/23 0857  NA 136 138 141 139 138 139  K 3.4* 3.5 3.2* 4.1 3.6 3.5  CL 105 108 107 111 107 105  CO2 20* 21* 21* 19* 25 25  GLUCOSE 103* 110* 129* 95 87 160*  BUN 15 12 9 9 6 6   CREATININE 0.98 0.88 1.27* 1.04* 1.12* 0.79  CALCIUM 9.0 9.0 9.5 8.6* 8.6* 9.3  MG 1.8 1.9  --   --   --  1.6*    CBC: Recent Labs  Lab 09/30/23 0759 10/01/23 1249 10/02/23 1013 10/03/23 0315 10/05/23 0857  WBC 14.8* 13.3* 11.1* 9.2 11.3*  HGB 13.7 13.5 13.5 11.4* 12.5  HCT 36.7 37.3 37.0 31.5* 34.9*  MCV 83.2 85.0 85.6 86.8 86.4  PLT 298 438* 501* 407* 525*    Time spent in discharge (includes decision making & examination of pt): 35 minutes  10/05/2023, 12:53 PM   Lonia Blood, Dominguez [facility]  [phone]

## 2023-10-05 NOTE — Progress Notes (Signed)
   10/05/23   To Whom it may concern,  [name] was hospitalized at [facility] from 09/29/2023 through 10/05/2023.  She has been advised that she should not return to work until Monday, 10/16/23.    Should you have further questions please feel free to contact our office.  Be advised that no medical information will be divulged without a signed HIPA release from the patient.    Sincerely,  Lonia Blood, MD [facility]  [phone]

## 2023-10-05 NOTE — Progress Notes (Signed)
 Dc instructions given to pt. Work note given to pt. DC nurse taking pt to discharge lounge.

## 2023-10-06 ENCOUNTER — Encounter: Payer: Self-pay | Admitting: Medical Oncology

## 2023-10-06 NOTE — Progress Notes (Signed)
 [facility]  Outgoing call [facility] with patient regarding scheduling appointment for Monday 3/17. My call back number provided.   Gregary Cromer, RN, BSN, [facility] Oncology Nurse Navigator, [facility] 10/06/2023 9:57 AM

## 2023-10-09 ENCOUNTER — Inpatient Hospital Stay

## 2023-10-09 ENCOUNTER — Inpatient Hospital Stay: Attending: Physician Assistant | Admitting: Physician Assistant

## 2023-10-09 ENCOUNTER — Encounter: Payer: Self-pay | Admitting: Medical Oncology

## 2023-10-09 VITALS — BP 142/96 | HR 76 | Temp 97.6°F | Resp 18 | Ht 67.0 in | Wt 150.6 lb

## 2023-10-09 DIAGNOSIS — I119 Hypertensive heart disease without heart failure: Secondary | ICD-10-CM | POA: Diagnosis not present

## 2023-10-09 DIAGNOSIS — D582 Other hemoglobinopathies: Secondary | ICD-10-CM | POA: Diagnosis not present

## 2023-10-09 DIAGNOSIS — R1084 Generalized abdominal pain: Secondary | ICD-10-CM | POA: Insufficient documentation

## 2023-10-09 DIAGNOSIS — Z87891 Personal history of nicotine dependence: Secondary | ICD-10-CM | POA: Diagnosis not present

## 2023-10-09 DIAGNOSIS — D7389 Other diseases of spleen: Secondary | ICD-10-CM | POA: Diagnosis not present

## 2023-10-09 DIAGNOSIS — I082 Rheumatic disorders of both aortic and tricuspid valves: Secondary | ICD-10-CM | POA: Insufficient documentation

## 2023-10-09 DIAGNOSIS — Z814 Family history of other substance abuse and dependence: Secondary | ICD-10-CM | POA: Insufficient documentation

## 2023-10-09 DIAGNOSIS — J432 Centrilobular emphysema: Secondary | ICD-10-CM | POA: Diagnosis not present

## 2023-10-09 DIAGNOSIS — Z818 Family history of other mental and behavioral disorders: Secondary | ICD-10-CM | POA: Insufficient documentation

## 2023-10-09 DIAGNOSIS — R112 Nausea with vomiting, unspecified: Secondary | ICD-10-CM | POA: Insufficient documentation

## 2023-10-09 DIAGNOSIS — F909 Attention-deficit hyperactivity disorder, unspecified type: Secondary | ICD-10-CM | POA: Diagnosis not present

## 2023-10-09 DIAGNOSIS — K7689 Other specified diseases of liver: Secondary | ICD-10-CM | POA: Diagnosis not present

## 2023-10-09 DIAGNOSIS — R7989 Other specified abnormal findings of blood chemistry: Secondary | ICD-10-CM | POA: Diagnosis not present

## 2023-10-09 DIAGNOSIS — Z825 Family history of asthma and other chronic lower respiratory diseases: Secondary | ICD-10-CM | POA: Insufficient documentation

## 2023-10-09 DIAGNOSIS — K029 Dental caries, unspecified: Secondary | ICD-10-CM | POA: Diagnosis not present

## 2023-10-09 DIAGNOSIS — Z79899 Other long term (current) drug therapy: Secondary | ICD-10-CM | POA: Diagnosis not present

## 2023-10-09 LAB — CBC WITH DIFFERENTIAL (CANCER CENTER ONLY)
Abs Immature Granulocytes: 0.03 10*3/uL (ref 0.00–0.07)
Basophils Absolute: 0.1 10*3/uL (ref 0.0–0.1)
Basophils Relative: 1 %
Eosinophils Absolute: 0.3 10*3/uL (ref 0.0–0.5)
Eosinophils Relative: 4 %
HCT: 36.7 % (ref 36.0–46.0)
Hemoglobin: 13.1 g/dL (ref 12.0–15.0)
Immature Granulocytes: 0 %
Lymphocytes Relative: 19 %
Lymphs Abs: 1.6 10*3/uL (ref 0.7–4.0)
MCH: 30.6 pg (ref 26.0–34.0)
MCHC: 35.7 g/dL (ref 30.0–36.0)
MCV: 85.7 fL (ref 80.0–100.0)
Monocytes Absolute: 0.7 10*3/uL (ref 0.1–1.0)
Monocytes Relative: 9 %
Neutro Abs: 5.5 10*3/uL (ref 1.7–7.7)
Neutrophils Relative %: 67 %
Platelet Count: 456 10*3/uL — ABNORMAL HIGH (ref 150–400)
RBC: 4.28 MIL/uL (ref 3.87–5.11)
RDW: 12.8 % (ref 11.5–15.5)
WBC Count: 8.2 10*3/uL (ref 4.0–10.5)
nRBC: 0 % (ref 0.0–0.2)

## 2023-10-09 LAB — CMP (CANCER CENTER ONLY)
ALT: 22 U/L (ref 0–44)
AST: 19 U/L (ref 15–41)
Albumin: 4.5 g/dL (ref 3.5–5.0)
Alkaline Phosphatase: 55 U/L (ref 38–126)
Anion gap: 5 (ref 5–15)
BUN: 10 mg/dL (ref 6–20)
CO2: 29 mmol/L (ref 22–32)
Calcium: 9.8 mg/dL (ref 8.9–10.3)
Chloride: 104 mmol/L (ref 98–111)
Creatinine: 0.97 mg/dL (ref 0.44–1.00)
GFR, Estimated: >60 mL/min (ref >60)
Glucose, Bld: 91 mg/dL (ref 70–99)
Potassium: 4.3 mmol/L (ref 3.5–5.1)
Sodium: 138 mmol/L (ref 135–145)
Total Bilirubin: 0.5 mg/dL (ref 0.0–1.2)
Total Protein: 8 g/dL (ref 6.5–8.1)

## 2023-10-09 LAB — SEDIMENTATION RATE: Sed Rate: 33 mm/h — ABNORMAL HIGH (ref 0–22)

## 2023-10-09 LAB — C-REACTIVE PROTEIN: CRP: 0.6 mg/dL (ref <1.0)

## 2023-10-09 NOTE — Progress Notes (Signed)
 [facility]  Patient presented to her appointment alone this afternoon. I introduced myself and provided patient with my direct contact information for any questions/concerns she may have.   Gregary Cromer, RN, BSN, [facility] Oncology Nurse Navigator, [facility] 10/09/2023 4:26 PM

## 2023-10-09 NOTE — Patient Instructions (Signed)
 Diagnostic Clinic Office Visit Discharge Information and Instructions  Thank you for choosing [facility] for your healthcare needs.  Below is a summary of today's discussion, along with our contact information and an outline of what to expect next.  Reason for Visit:  splenic lesions  Proposed Diagnostic Care Plan: Labs collected today PET scan ordered. Please call the Central Scheduling Department to schedule your scan after we let you know it is approved. The phone number is [phone]. I will call you once I have the scan results.   What to Expect: - Generally, when lab tests are ordered the results can take up to 1 week for results to be available.  At that point, we will contact you to discuss your results with you.  Unless there is a critical result, we will typically wait for all of your lab results to be available before contacting you. - If a biopsy is part of your Care Plan, those results can take on average 7-10 days to result.  Once results are available, we will contact you to discuss your pathology results and any next steps. - If you have additional imaging ordered, such as a CT Scan, MRI, Ultrasound, Bone Scan, or PET scan, your imaging will need to be authorized then scheduled with the earliest available appointment.  You may be asked to travel to another hospital within [facility] who has a sooner availability, please consider doing so if asked. - If you use MyChart, your results will be available to you in the MyChart portal.  Your provider will be in touch with you as soon as all of your results are available to be discussed.  Your Diagnostic Clinic Provider:  Namon Cirri PA-C and Dr. Leonides Schanz Your Diagnostic Navigator:  Chauncy Lean RN, office number [phone]  If you or your caregiver have number blocking on your cell phones, please ensure the cancer center's numbers are not blocked.  If you are not a registered MyChart user, please consider enrolling in  MyChart to receive your test results and visit notes.  You can also access your discharge instructions electronically.  MyChart also gives you an electronic means to communicate with your Care Team instead of needing to call in to the cancer center.  We appreciate you trusting [place] with your healthcare and look forward to partnering with you as we work to uncover what your potential diagnosis may be.  Please do not hesitate to reach out at any point with questions or concerns.

## 2023-10-09 NOTE — Progress Notes (Signed)
 [facility] [phone]   [phone]  INITIAL CONSULTATION:  Patient Care Team: Ellender Hose, NP as PCP - General (Family Medicine)  CHIEF COMPLAINTS/PURPOSE OF CONSULTATION:  "Splenic lesions "  HISTORY OF PRESENTING ILLNESS:  [name] 43 y.o. female with medical history significant for dental abscess, hemoglobin C trait, ADD.  On review of the previous records patient presented to the ED on 09/29/2023 after having nausea, vomiting and fatigue while on a work trip in [place].  Workup included a positive D-dimer therefore patient underwent CTA and CT abdomen.  CT abdomen showed diffuse innumerable hyperdense lesions in the spleen.  Patient was subsequently admitted to the hospital.  During admission she had MR abdomen that also showed innumerable splenic nodules measuring up to maximum size of about 2.4 cm.  Radiologist commented that these are relatively isointense to background splenic parenchyma on T1 imaging and only minimally hyperintense on T2 imaging.  Overall imaging was thought to be nonspecific and etiology could include infection versus sarcoidosis versus multiple venous malformations versus metastatic disease to the spleen versus lymphoma of the spleen.  Also during admission patient was treated for dental abscess with IV antibiotics including vancomycin, ceftriaxone, and Unasyn. Per note on 3/13 ID discussed case with IR regarding possible aspiration of splenic lesions however after imaging review it was deemed too high risk of hemorrhage to aspirate.  During admission patient had negative blood cultures and TTE showing no vegetation. Patient discharged with a 6 week course of Augmentin.  On exam today patient admits to being asymptomatic. She is worried about the work up she will need otherwise denies being in any pain. She is still taking her Augmentin as prescribed. She has not had any fevers since hospital  discharge. Patient admits to have a colonoscopy approximately 15 years ago for unintentional weight loss while living in [place] and results were normal. She had a mammogram last year that did not show evidence of breast cancer. Family history is limited as she is adopted, she is only aware of a grandmother having COPD. Patient reports she quit smoking and drinking during her recent hospitalization. She has history of smoking half ppd x 5 years and drinking alcohol daily for the last x 1 year.  MEDICAL HISTORY:  Past Medical History:  Diagnosis Date   ADHD (attention deficit hyperactivity disorder)    Anemia    Depression    Headache     SURGICAL HISTORY: Past Surgical History:  Procedure Laterality Date   NO PAST SURGERIES     TRANSESOPHAGEAL ECHOCARDIOGRAM (CATH LAB) N/A 10/03/2023   Procedure: TRANSESOPHAGEAL ECHOCARDIOGRAM;  Surgeon: Pricilla Riffle, MD;  Location: [facility] INVASIVE CV LAB;  Service: Cardiovascular;  Laterality: N/A;    SOCIAL HISTORY: Social History   Socioeconomic History   Marital status: Single    Spouse name: Not on file   Number of children: Not on file   Years of education: Not on file   Highest education level: Not on file  Occupational History   Not on file  Tobacco Use   Smoking status: Former    Current packs/day: 0.00    Types: Cigarettes    Quit date: 12/28/2013    Years since quitting: 9.7   Smokeless tobacco: Never  Substance and Sexual Activity   Alcohol use: No   Drug use: Not Currently    Types: Marijuana    Comment: 12/28/2013   Sexual activity: Yes    Birth control/protection: None  Other Topics Concern   Not on file  Social History Narrative   Not on file   Social Drivers of Health   Financial Resource Strain: Not on file  Food Insecurity: No Food Insecurity (09/30/2023)   Hunger Vital Sign    Worried About Running Out of Food in the Last Year: Never true    Ran Out of Food in the Last Year: Never true  Transportation Needs: No  Transportation Needs (09/30/2023)   PRAPARE - Administrator, Civil Service (Medical): No    Lack of Transportation (Non-Medical): No  Physical Activity: Not on file  Stress: Not on file  Social Connections: Unknown (05/10/2022)   Received from [facility]   Social Network    Social Network: Not on file  Intimate Partner Violence: Not At Risk (09/30/2023)   Humiliation, Afraid, Rape, and Kick questionnaire    Fear of Current or Ex-Partner: No    Emotionally Abused: No    Physically Abused: No    Sexually Abused: No    FAMILY HISTORY: Family History  Problem Relation Age of Onset   Asthma Sister    Depression Sister    Asthma Maternal Grandmother    Drug abuse Other    Alcohol abuse Neg Hx    Arthritis Neg Hx    Birth defects Neg Hx    Cancer Neg Hx    COPD Neg Hx    Diabetes Neg Hx    Hypertension Neg Hx    Hyperlipidemia Neg Hx    Hearing loss Neg Hx    Early death Neg Hx    Heart disease Neg Hx    Kidney disease Neg Hx    Mental illness Neg Hx    Mental retardation Neg Hx    Learning disabilities Neg Hx    Miscarriages / Stillbirths Neg Hx    Stroke Neg Hx    Vision loss Neg Hx    Varicose Veins Neg Hx     ALLERGIES:  has no known allergies.  MEDICATIONS:  Current Outpatient Medications  Medication Sig Dispense Refill   acetaminophen (PAIN RELIEVER) 325 MG tablet Take 650 mg by mouth every 6 (six) hours as needed.     amoxicillin-clavulanate (AUGMENTIN) 875-125 MG tablet Take 1 tablet by mouth every 12 (twelve) hours. 84 tablet 0   folic acid (FOLVITE) 1 MG tablet Take 1 mg by mouth daily.     metoprolol tartrate (LOPRESSOR) 50 MG tablet Take 1 tablet (50 mg total) by mouth 2 (two) times daily. 60 tablet 2   Multiple Vitamin (MULTIVITAMIN) tablet Take 1 tablet by mouth daily.     NIFEdipine (ADALAT CC) 60 MG 24 hr tablet Take 1 tablet (60 mg total) by mouth daily. 30 tablet 2   OVER THE COUNTER MEDICATION Take 1 tablet by mouth daily. Menopause  support.     thiamine (VITAMIN B1) 100 MG tablet Take 100 mg by mouth daily.     No current facility-administered medications for this visit.    REVIEW OF SYSTEMS:   All other systems are reviewed and are negative for acute change except as noted in the HPI.  PHYSICAL EXAMINATION: ECOG PERFORMANCE STATUS: 1 - Symptomatic but completely ambulatory  Vitals:   10/09/23 1239  BP: (!) 142/96  Pulse: 76  Resp: 18  Temp: 97.6 F (36.4 C)  SpO2: 100%   Filed Weights   10/09/23 1239  Weight: 150 lb 9.6 oz (68.3 kg)    Physical Exam Vitals  reviewed.  Constitutional:      Appearance: She is not ill-appearing or toxic-appearing.  HENT:     Head: Normocephalic.     Right Ear: External ear normal.     Left Ear: External ear normal. There is no impacted cerumen.     Nose: Nose normal.  Eyes:     General: No scleral icterus. Cardiovascular:     Rate and Rhythm: Normal rate and regular rhythm.     Pulses: Normal pulses.     Heart sounds: Normal heart sounds.  Pulmonary:     Effort: Pulmonary effort is normal.     Breath sounds: Normal breath sounds.  Abdominal:     General: There is no distension.     Palpations: Abdomen is soft.     Tenderness: There is no abdominal tenderness.  Musculoskeletal:        General: Normal range of motion.     Cervical back: Normal range of motion.  Skin:    General: Skin is warm and dry.  Neurological:     Mental Status: She is alert.       LABORATORY DATA:  I have reviewed the data as listed    Latest Ref Rng & Units 10/09/2023    1:42 PM 10/05/2023    8:57 AM 10/03/2023    3:15 AM  CBC  WBC 4.0 - 10.5 K/uL 8.2  11.3  9.2   Hemoglobin 12.0 - 15.0 g/dL 30.8  65.7  84.6   Hematocrit 36.0 - 46.0 % 36.7  34.9  31.5   Platelets 150 - 400 K/uL 456  525  407        Latest Ref Rng & Units 10/05/2023    8:57 AM 10/04/2023   10:50 AM 10/03/2023    3:15 AM  CMP  Glucose 70 - 99 mg/dL 962  87  95   BUN 6 - 20 mg/dL 6  6  9    Creatinine  0.44 - 1.00 mg/dL 9.52  8.41  3.24   Sodium 135 - 145 mmol/L 139  138  139   Potassium 3.5 - 5.1 mmol/L 3.5  3.6  4.1   Chloride 98 - 111 mmol/L 105  107  111   CO2 22 - 32 mmol/L 25  25  19    Calcium 8.9 - 10.3 mg/dL 9.3  8.6  8.6   Total Protein 6.5 - 8.1 g/dL 7.1     Total Bilirubin 0.0 - 1.2 mg/dL 0.3     Alkaline Phos 38 - 126 U/L 50     AST 15 - 41 U/L 24     ALT 0 - 44 U/L 23        RADIOGRAPHIC STUDIES: I have personally reviewed the radiological images as listed and agreed with the findings in the report. ECHO TEE Result Date: 10/04/2023    TRANSESOPHOGEAL ECHO REPORT   Patient Name:   [name] Date of Exam: 10/03/2023 Medical Rec #:  [id number]          Height:       67.0 in Accession #:    [id number]         Weight:       141.5 lb Date of Birth:  1980-12-01           BSA:          1.746 m Patient Age:    42 years           BP: Patient Gender: F  HR: Exam Location:  Inpatient Procedure: Transesophageal Echo (Both Spectral and Color Flow Doppler were            utilized during procedure). Indications:    r/o endocarditis  Referring Phys: [id number] CALLIE E GOODRICH PROCEDURE: The transesophogeal probe was passed without difficulty through the esophogus of the patient. Sedation performed by different physician. The patient developed no complications during the procedure.  IMPRESSIONS  1. No vegetations seen.  2. The left ventricle has normal function.  3. Right ventricular systolic function is normal. The right ventricular size is normal.  4. No left atrial/left atrial appendage thrombus was detected.  5. Small PFO present as tested with injection of agitated saline with bubbles appearing in LA.  6. The mitral valve is normal in structure. Trivial mitral valve regurgitation.  7. The aortic valve is tricuspid. Aortic valve regurgitation is not visualized.  8. 3D performed of the mitral valve and demonstrates No vegetation. FINDINGS  Left Ventricle: The left ventricle has  normal function. The left ventricular internal cavity size was normal in size. Right Ventricle: The right ventricular size is normal. Right ventricular systolic function is normal. Left Atrium: Left atrial size was normal in size. No left atrial/left atrial appendage thrombus was detected. Right Atrium: Right atrial size was normal in size. Pericardium: There is no evidence of pericardial effusion. Mitral Valve: The mitral valve is normal in structure. Trivial mitral valve regurgitation. Tricuspid Valve: The tricuspid valve is normal in structure. Tricuspid valve regurgitation is trivial. Aortic Valve: The aortic valve is tricuspid. Aortic valve regurgitation is not visualized. Pulmonic Valve: The pulmonic valve was normal in structure. Pulmonic valve regurgitation is mild. Aorta: The aortic root and ascending aorta are structurally normal, with no evidence of dilitation. IAS/Shunts: Small PFO present as tested with injection of agitated saline with bubbles appearing in LA. Additional Comments: 3D was performed not requiring image post processing on an independent workstation and was normal. Dietrich Pates MD Electronically signed by Dietrich Pates MD Signature Date/Time: 10/04/2023/1:29:17 AM    Final    EP STUDY Result Date: 10/03/2023 See surgical note for result.  DG Orthopantogram Result Date: 10/03/2023 CLINICAL DATA:  Infection EXAM: ORTHOPANTOGRAM/PANORAMIC COMPARISON:  None Available. FINDINGS: The midline dentition is out of plane and out of focus. Dental caries are noted involving tooth 16, with near complete erosion of the crown and tooth 15 with partial erosion of the crown. There is, additionally, periapical lucency in keeping with superimposed periodontal disease. Tooth # 17, # 32 are unerupted. No mandibular fracture, dislocation, or focal lesion. IMPRESSION: 1. Dental caries and periodontal disease involving tooth # 16 and tooth # 15. Electronically Signed   By: Helyn Numbers M.D.   On: 10/03/2023  02:45   MR ABDOMEN W WO CONTRAST Result Date: 10/01/2023 CLINICAL DATA:  Splenic lesions on CT scan. EXAM: MRI ABDOMEN WITHOUT AND WITH CONTRAST TECHNIQUE: Multiplanar multisequence MR imaging of the abdomen was performed both before and after the administration of intravenous contrast. CONTRAST:  6mL GADAVIST GADOBUTROL 1 MMOL/ML IV SOLN COMPARISON:  CT scan 09/30/2023. FINDINGS: Lower chest: Unremarkable. Hepatobiliary: 13 mm simple cyst noted posterior right liver. 12 mm multi lobular cyst in the medial right liver towards the caudate lobe may have some thin smooth internal septation, but imaging features are generally benign. No followup imaging is recommended. Additional tiny T2 hyperintensities are too small to characterize but compatible with tiny cysts or benign biliary hamartomas. No followup imaging is recommended. Liver otherwise unremarkable. There  is no evidence for gallstones, gallbladder wall thickening, or pericholecystic fluid. No intrahepatic or extrahepatic biliary dilation. Pancreas: No focal mass lesion. No dilatation of the main duct. No intraparenchymal cyst. No peripancreatic edema. Spleen: No splenomegaly. As noted on the prior study, there are innumerable splenic nodules measuring up to maximum size of about 2.4 cm. These are relatively isointense to background splenic parenchyma on T1 imaging and only minimally hyperintense on T2 imaging. Nodules hypo enhance after IV contrast administration but delayed imaging shows enhancement close to background parenchymal levels. Adrenals/Urinary Tract: No adrenal nodule or mass. Kidneys unremarkable. Stomach/Bowel: Stomach is unremarkable. No gastric wall thickening. No evidence of outlet obstruction. Duodenum is normally positioned as is the ligament of Treitz. No small bowel or colonic dilatation within the visualized abdomen. Vascular/Lymphatic: No abdominal aortic aneurysm. No abdominal lymphadenopathy. Other:  No intraperitoneal free fluid.  Musculoskeletal: No focal suspicious marrow enhancement within the visualized bony anatomy. IMPRESSION: 1. Innumerable splenic nodules measuring up to maximum size of about 2.4 cm. These are relatively isointense to background splenic parenchyma on T1 imaging and only minimally hyperintense on T2 imaging. Nodules hypo enhance after IV contrast administration but delayed imaging shows enhancement close to background parenchymal levels. Imaging features are nonspecific. Infectious etiology is a consideration and atypical agents can be seen in the setting of immunocompromise hosts. Sarcoidosis would be a consideration although the lesions do enhance on delayed postcontrast imaging and splenic sarcoidosis is typically seen in the setting of sarcoidosis elsewhere. Multiple venous malformations are a consideration although there are more lesions than typically seen in that setting. Metastatic disease to the spleen is rare and lymphoma of the spleen is usually secondary, with lymphomatous disease present elsewhere. Primary splenic neoplasm is rare. Close follow-up warranted. 2. Benign hepatic cysts and/or biliary hamartomas. No followup imaging is recommended. Electronically Signed   By: Kennith Center M.D.   On: 10/01/2023 12:56   ECHOCARDIOGRAM COMPLETE Result Date: 09/30/2023    ECHOCARDIOGRAM REPORT   Patient Name:   [name] Date of Exam: 09/30/2023 Medical Rec #:  [id number]          Height:       67.0 in Accession #:    [id number]         Weight:       141.5 lb Date of Birth:  09-10-1980           BSA:          1.746 m Patient Age:    42 years           BP:           178/119 mmHg Patient Gender: F                  HR:           87 bpm. Exam Location:  Inpatient Procedure: 2D Echo, Cardiac Doppler and Color Doppler (Both Spectral and Color            Flow Doppler were utilized during procedure). Indications:    Murmur  History:        Patient has no prior history of Echocardiogram examinations.                  Risk Factors:Hypertension.  Sonographer:    Lamont Snowball Referring Phys: [id number] TIMOTHY S OPYD IMPRESSIONS  1. Abnormal septal motion. Left ventricular ejection fraction, by estimation, is 60 to 65%. The left ventricle has normal function. The left ventricle has  no regional wall motion abnormalities. There is moderate left ventricular hypertrophy. Left ventricular diastolic parameters are consistent with Grade I diastolic dysfunction (impaired relaxation).  2. Right ventricular systolic function is normal. The right ventricular size is normal.  3. The mitral valve is abnormal. Trivial mitral valve regurgitation. No evidence of mitral stenosis.  4. The aortic valve is tricuspid. Aortic valve regurgitation is trivial. No aortic stenosis is present.  5. The inferior vena cava is normal in size with greater than 50% respiratory variability, suggesting right atrial pressure of 3 mmHg. FINDINGS  Left Ventricle: Abnormal septal motion. Left ventricular ejection fraction, by estimation, is 60 to 65%. The left ventricle has normal function. The left ventricle has no regional wall motion abnormalities. Strain was performed and the global longitudinal strain is indeterminate. The left ventricular internal cavity size was normal in size. There is moderate left ventricular hypertrophy. Left ventricular diastolic parameters are consistent with Grade I diastolic dysfunction (impaired relaxation). Right Ventricle: The right ventricular size is normal. No increase in right ventricular wall thickness. Right ventricular systolic function is normal. Left Atrium: Left atrial size was normal in size. Right Atrium: Right atrial size was normal in size. Pericardium: There is no evidence of pericardial effusion. Mitral Valve: The mitral valve is abnormal. There is mild thickening of the mitral valve leaflet(s). Trivial mitral valve regurgitation. No evidence of mitral valve stenosis. MV peak gradient, 15.2 mmHg. The mean mitral valve  gradient is 3.0 mmHg. Tricuspid Valve: The tricuspid valve is normal in structure. Tricuspid valve regurgitation is mild . No evidence of tricuspid stenosis. Aortic Valve: The aortic valve is tricuspid. Aortic valve regurgitation is trivial. No aortic stenosis is present. Pulmonic Valve: The pulmonic valve was normal in structure. Pulmonic valve regurgitation is not visualized. No evidence of pulmonic stenosis. Aorta: The aortic root is normal in size and structure. Venous: The inferior vena cava is normal in size with greater than 50% respiratory variability, suggesting right atrial pressure of 3 mmHg. IAS/Shunts: No atrial level shunt detected by color flow Doppler. Additional Comments: 3D was performed not requiring image post processing on an independent workstation and was indeterminate.  LEFT VENTRICLE PLAX 2D LVIDd:         3.60 cm   Diastology LVIDs:         2.70 cm   LV e' medial:    7.62 cm/s LV PW:         1.50 cm   LV E/e' medial:  5.8 LV IVS:        1.50 cm   LV e' lateral:   12.70 cm/s LVOT diam:     2.00 cm   LV E/e' lateral: 3.5 LVOT Area:     3.14 cm  RIGHT VENTRICLE             IVC RV Basal diam:  2.70 cm     IVC diam: 0.80 cm RV S prime:     14.40 cm/s TAPSE (M-mode): 2.0 cm LEFT ATRIUM             Index        RIGHT ATRIUM          Index LA Vol (A2C):   31.7 ml 18.16 ml/m  RA Area:     9.24 cm LA Vol (A4C):   25.4 ml 14.55 ml/m  RA Volume:   17.20 ml 9.85 ml/m LA Biplane Vol: 28.2 ml 16.15 ml/m   AORTA Ao Root diam: 3.00 cm Ao Asc diam:  3.50 cm MITRAL VALVE               TRICUSPID VALVE MV Area (PHT): 3.35 cm    TR Peak grad:   17.0 mmHg MV Peak grad:  15.2 mmHg   TR Vmax:        206.00 cm/s MV Mean grad:  3.0 mmHg MV Vmax:       1.95 m/s    SHUNTS MV Vmean:      77.6 cm/s   Systemic Diam: 2.00 cm MV Decel Time: 227 msec MV E velocity: 44.50 cm/s MV A velocity: 73.10 cm/s MV E/A ratio:  0.61 Charlton Haws MD Electronically signed by Charlton Haws MD Signature Date/Time: 09/30/2023/2:07:41 PM     Final    CT Angio Chest PE W/Cm &/Or Wo Cm Result Date: 09/30/2023 CLINICAL DATA:  Positive D-dimer with nausea, vomiting and abdominal pain. EXAM: CT ANGIOGRAPHY CHEST CT ABDOMEN AND PELVIS WITH CONTRAST TECHNIQUE: Multidetector CT imaging of the chest was performed using the standard protocol during bolus administration of intravenous contrast. Multiplanar CT image reconstructions and MIPs were obtained to evaluate the vascular anatomy. Multidetector CT imaging of the abdomen and pelvis was performed using the standard protocol during bolus administration of intravenous contrast. RADIATION DOSE REDUCTION: This exam was performed according to the departmental dose-optimization program which includes automated exposure control, adjustment of the mA and/or kV according to patient size and/or use of iterative reconstruction technique. CONTRAST:  OMNIPAQUE IOHEXOL 350 MG/ML SOLN COMPARISON:  Portable chest yesterday is the only relevant prior study. FINDINGS: CTA CHEST FINDINGS Cardiovascular: Satisfactory opacification of the pulmonary arteries to the segmental level. No evidence of pulmonary embolism. Normal heart size. No pericardial effusion. Normal aorta and great vessels. Mediastinum/Nodes: No enlarged mediastinal, hilar, or axillary lymph nodes. Thyroid gland, trachea, and esophagus demonstrate no significant findings. Lungs/Pleura: There are minimal paraseptal and centrilobular emphysematous changes in the upper lung apices. There is mild generalized bronchial thickening. No consolidation, effusion or nodules are seen. Mild elevation right hemidiaphragm. Musculoskeletal: No chest wall abnormality. No acute or significant osseous findings. Review of the MIP images confirms the above findings. CT ABDOMEN and PELVIS FINDINGS Hepatobiliary: There is a cyst posteriorly in hepatic segment 5, Hounsfield density is 21. Elsewhere there are occasional scattered subcentimeter hypodensities which too small to  characterize. The liver is 19 cm length slightly steatotic, without discrete mass enhancement. Pancreas: No abnormality. Spleen: Normal in size. There are diffuse innumerable hypodense lesions within the spleen, some showing rim enhancement. Largest measures 2.1 cm medially. Differential diagnosis includes metastatic disease to the spleen, splenic lymphoma, infectious process, and granulomatous disease. Adrenals/Urinary Tract: Adrenal glands are unremarkable. Kidneys are normal, without renal calculi, focal lesion, or hydronephrosis. Bladder is unremarkable. Stomach/Bowel: No dilatation or wall thickening including the appendix. Vascular/Lymphatic: No significant vascular findings are present. No enlarged abdominal or pelvic lymph nodes. Reproductive: Limited fine detail through the pelvis due to motion artifacts. Uterus and bilateral adnexa are unremarkable. Multiple pelvic phleboliths. Other: Small umbilical fat hernia. No incarcerated hernia. No free fluid or free air. 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 No L5-S1 spondylolisthesis. No acute or other significant osseous findings. Review of the MIP images confirms the above findings. IMPRESSION: 1. No evidence of aortic dissection or pulmonary embolus. 2. Minimal biapical emphysema with mild bronchitis. 3. Diffuse innumerable hypodense lesions in the spleen, some showing rim enhancement. Differential diagnosis includes metastatic disease to the spleen, splenic lymphoma, infectious process, and granulomatous disease. Consider  MRI without and with contrast as follow-up imaging. 4. Slightly enlarged steatotic liver with a cyst in segment 5 and occasional subcentimeter hypodensities which are too small to characterize. 5. Small umbilical fat hernia. 6. 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 No L5-S1 spondylolisthesis. Emphysema (ICD10-J43.9). Electronically Signed   By: Almira Bar M.D.   On: 09/30/2023 02:04    CT ABDOMEN PELVIS W CONTRAST Result Date: 09/30/2023 CLINICAL DATA:  Positive D-dimer with nausea, vomiting and abdominal pain. EXAM: CT ANGIOGRAPHY CHEST CT ABDOMEN AND PELVIS WITH CONTRAST TECHNIQUE: Multidetector CT imaging of the chest was performed using the standard protocol during bolus administration of intravenous contrast. Multiplanar CT image reconstructions and MIPs were obtained to evaluate the vascular anatomy. Multidetector CT imaging of the abdomen and pelvis was performed using the standard protocol during bolus administration of intravenous contrast. RADIATION DOSE REDUCTION: This exam was performed according to the departmental dose-optimization program which includes automated exposure control, adjustment of the mA and/or kV according to patient size and/or use of iterative reconstruction technique. CONTRAST:  OMNIPAQUE IOHEXOL 350 MG/ML SOLN COMPARISON:  Portable chest yesterday is the only relevant prior study. FINDINGS: CTA CHEST FINDINGS Cardiovascular: Satisfactory opacification of the pulmonary arteries to the segmental level. No evidence of pulmonary embolism. Normal heart size. No pericardial effusion. Normal aorta and great vessels. Mediastinum/Nodes: No enlarged mediastinal, hilar, or axillary lymph nodes. Thyroid gland, trachea, and esophagus demonstrate no significant findings. Lungs/Pleura: There are minimal paraseptal and centrilobular emphysematous changes in the upper lung apices. There is mild generalized bronchial thickening. No consolidation, effusion or nodules are seen. Mild elevation right hemidiaphragm. Musculoskeletal: No chest wall abnormality. No acute or significant osseous findings. Review of the MIP images confirms the above findings. CT ABDOMEN and PELVIS FINDINGS Hepatobiliary: There is a cyst posteriorly in hepatic segment 5, Hounsfield density is 21. Elsewhere there are occasional scattered subcentimeter hypodensities which too small to characterize. The  liver is 19 cm length slightly steatotic, without discrete mass enhancement. Pancreas: No abnormality. Spleen: Normal in size. There are diffuse innumerable hypodense lesions within the spleen, some showing rim enhancement. Largest measures 2.1 cm medially. Differential diagnosis includes metastatic disease to the spleen, splenic lymphoma, infectious process, and granulomatous disease. Adrenals/Urinary Tract: Adrenal glands are unremarkable. Kidneys are normal, without renal calculi, focal lesion, or hydronephrosis. Bladder is unremarkable. Stomach/Bowel: No dilatation or wall thickening including the appendix. Vascular/Lymphatic: No significant vascular findings are present. No enlarged abdominal or pelvic lymph nodes. Reproductive: Limited fine detail through the pelvis due to motion artifacts. Uterus and bilateral adnexa are unremarkable. Multiple pelvic phleboliths. Other: Small umbilical fat hernia. No incarcerated hernia. No free fluid or free air. 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 No L5-S1 spondylolisthesis. No acute or other significant osseous findings. Review of the MIP images confirms the above findings. IMPRESSION: 1. No evidence of aortic dissection or pulmonary embolus. 2. Minimal biapical emphysema with mild bronchitis. 3. Diffuse innumerable hypodense lesions in the spleen, some showing rim enhancement. Differential diagnosis includes metastatic disease to the spleen, splenic lymphoma, infectious process, and granulomatous disease. Consider MRI without and with contrast as follow-up imaging. 4. Slightly enlarged steatotic liver with a cyst in segment 5 and occasional subcentimeter hypodensities which are too small to characterize. 5. Small umbilical fat hernia. 6. 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 No L5-S1 spondylolisthesis. Emphysema (ICD10-J43.9). Electronically Signed   By: Almira Bar M.D.   On: 09/30/2023 02:04   DG Chest Port 1  View Result Date: 09/29/2023 CLINICAL DATA:  vomiting EXAM: PORTABLE CHEST 1 VIEW COMPARISON:  None Available. FINDINGS: The cardiomediastinal contours are normal. The lungs are clear. Pulmonary vasculature is normal. No consolidation, pleural effusion, or pneumothorax. No acute osseous abnormalities are seen. IMPRESSION: No active disease. Electronically Signed   By: Narda Rutherford M.D.   On: 09/29/2023 23:39    ASSESSMENT & PLAN [name] is a 43 y.o. female presenting [facility] for consultation regarding splenic lesions. We have reviewed etiologies including infectious process, inflammatory process, lymphoproliferative disorder, metastatic disease. Patient will proceed with laboratory workup today.   #Splenic lesions -Reviewed CT AP and MR abdomen results with patient. - Will collect labs today including CBC, CMP, ESR, and flow cytometry - PET scan ordered for further evaluation. Patient prefers to have PET scan next week because of her work schedule.  #Age related screenings -UTD  -Patient will RTC when work up is complete.  Patient expressed understanding of the recommended workup and is agreeable to move forward.   All questions were answered. The patient knows to call the clinic with any problems, questions or concerns.  Shared visit with Dr. Leonides Schanz.  Orders Placed This Encounter  Procedures   NM PET Image Initial (PI) Skull Base To Thigh    [facility] patient    Standing Status:   Future    Expected Date:   10/16/2023    Expiration Date:   10/08/2024    If indicated for the ordered procedure, I authorize the administration of a radiopharmaceutical per Radiology protocol:   Yes    Is the patient pregnant?:   No    Preferred imaging location?:   [facility]   CBC with Differential (Cancer Center Only)    Standing Status:   Future    Number of Occurrences:   1    Expiration Date:   10/08/2024   CMP (Cancer Center only)    Standing  Status:   Future    Number of Occurrences:   1    Expiration Date:   10/08/2024   Sedimentation rate    Standing Status:   Future    Number of Occurrences:   1    Expiration Date:   10/08/2024   C-reactive protein    Standing Status:   Future    Number of Occurrences:   1    Expiration Date:   10/08/2024   Flow Cytometry, Peripheral Blood (Oncology)    Standing Status:   Future    Number of Occurrences:   1    Expected Date:   10/09/2023    Expiration Date:   10/08/2024      I have spent a total of 60 minutes minutes of face-to-face and non-face-to-face time, preparing to see the patient, obtaining and/or reviewing separately obtained history, performing a medically appropriate examination, counseling and educating the patient, ordering medications/tests/procedures, referring and communicating with other health care professionals, documenting clinical information in the electronic health record, independently interpreting results and communicating results to the patient, and care coordination.   Namon Cirri PA-C Department of Hematology/Oncology [facility] at [facility] Phone: [phone]  I have read the above note and personally examined the patient. I agree with the assessment and plan as noted above.  Briefly Mrs. [name] is a 43 year old female who presents for evaluation of splenic lesions.  The patient was recently in the hospital in early March for intractable nausea and vomiting as  well as dental abscess.  As part of her initial evaluation she underwent a CT scan of the abdomen on 09/30/2023 which did show diffuse innumerable hypodense lesions in the spleen, some showing rim enhancement.  There was concern for metastatic disease versus splenic lymphoma or infectious process.  Infectious disease and primary care team did believe the etiology was infectious and started her on Augmentin therapy.  They did however request hematology evaluation  in order to rule out hematological malignancy.  At this time we agree findings are most consistent with infectious etiology.  In order to assure that the splenic lesions are not cancerous/lipomatous we will order a nuclear medicine PET CT scan.  Additionally will order ESR, CRP, CBC, and flow cytometry.  The patient voiced understanding of our findings and plan moving forward.   Ulysees Barns, MD Department of Hematology/Oncology [facility] at [facility] Phone: [phone] Pager: [phone] Email: [email or web address]

## 2023-10-10 LAB — SURGICAL PATHOLOGY

## 2023-10-11 ENCOUNTER — Encounter: Payer: Self-pay | Admitting: Medical Oncology

## 2023-10-11 LAB — FLOW CYTOMETRY

## 2023-10-12 ENCOUNTER — Telehealth: Payer: Self-pay | Admitting: Physician Assistant

## 2023-10-12 NOTE — Telephone Encounter (Signed)
 I notified [name] by phone regarding lab results. Labs show mildly elevated platelets at 456k and ESR at 33. Discussed with patient these finds are non specific. Patient is scheduled for a PET scan on 10/16/23 and patient will be called with those results. All of patient's questions were answered and she expressed understanding of the plan provided.

## 2023-10-16 ENCOUNTER — Telehealth: Payer: Self-pay

## 2023-10-16 ENCOUNTER — Encounter (HOSPITAL_COMMUNITY)

## 2023-10-16 ENCOUNTER — Encounter (HOSPITAL_COMMUNITY): Payer: Self-pay

## 2023-10-16 NOTE — Telephone Encounter (Signed)
 Returned message from patient related to questions about taking home medication prior to PET scan scheduled for today, 3/24. Per Daphane Shepherd, PA-C - patient can take metoprolol with sips of water. In addition, patient may skip 9 AM dose of augmentin, d/t concerns of GI upset, and resume taking starting at her evening dose today. Patient verbalized an understanding of the information and confirmed time of scan for today.

## 2023-10-19 ENCOUNTER — Encounter: Payer: Self-pay | Admitting: Family Medicine

## 2023-10-19 ENCOUNTER — Ambulatory Visit: Payer: Self-pay | Admitting: Family Medicine

## 2023-10-19 VITALS — BP 100/60 | HR 69 | Temp 98.2°F | Ht 67.0 in | Wt 146.0 lb

## 2023-10-19 DIAGNOSIS — Q2112 Patent foramen ovale: Secondary | ICD-10-CM

## 2023-10-19 DIAGNOSIS — F411 Generalized anxiety disorder: Secondary | ICD-10-CM

## 2023-10-19 DIAGNOSIS — I10 Essential (primary) hypertension: Secondary | ICD-10-CM

## 2023-10-19 DIAGNOSIS — D7389 Other diseases of spleen: Secondary | ICD-10-CM

## 2023-10-19 DIAGNOSIS — Z7689 Persons encountering health services in other specified circumstances: Secondary | ICD-10-CM

## 2023-10-19 MED ORDER — SERTRALINE HCL 50 MG PO TABS: 50.0000 mg | ORAL_TABLET | Freq: Every day | ORAL | 5 refills | Status: AC

## 2023-10-19 NOTE — Progress Notes (Signed)
 I,Jameka J Llittleton, CMA,acting as a Neurosurgeon for [employer], NP.,have documented all relevant documentation on the behalf of Ellender Hose, NP,as directed by  Ellender Hose, NP while in the presence of Ellender Hose, NP.  Subjective:  Patient ID: [name] , female    DOB: 1981-05-05 , 43 y.o.   MRN: [id number]  Chief Complaint  Patient presents with   hospital f/u    HPI  Patient is a 43 year old female who presents today to establish care. Patient states that she recently got sick at a trip out of state in [place] with cellulitis of the face d/t dental abscess and was admitted in the hospital from 09/29/23 to  10/05/23. In the process she had a TEE and it was accidentally discovered that she has a patent foramen ovale and a splenic lesion. Patient is now been followed by Oncology is a getting a PET scan tomorrow to rule out any metastasis of cancer. Patient states that she feels fine overall.     Past Medical History:  Diagnosis Date   ADHD (attention deficit hyperactivity disorder)    Anemia    Depression    Headache      Family History  Problem Relation Age of Onset   Asthma Sister    Depression Sister    Asthma Maternal Grandmother    Drug abuse Other    Alcohol abuse Neg Hx    Arthritis Neg Hx    Birth defects Neg Hx    Cancer Neg Hx    COPD Neg Hx    Diabetes Neg Hx    Hypertension Neg Hx    Hyperlipidemia Neg Hx    Hearing loss Neg Hx    Early death Neg Hx    Heart disease Neg Hx    Kidney disease Neg Hx    Mental illness Neg Hx    Mental retardation Neg Hx    Learning disabilities Neg Hx    Miscarriages / Stillbirths Neg Hx    Stroke Neg Hx    Vision loss Neg Hx    Varicose Veins Neg Hx      Current Outpatient Medications:    amoxicillin-clavulanate (AUGMENTIN) 875-125 MG tablet, Take 1 tablet by mouth every 12 (twelve) hours., Disp: 84 tablet, Rfl: 0   metoprolol tartrate (LOPRESSOR) 50 MG tablet, Take 1 tablet (50 mg total) by mouth 2 (two) times  daily., Disp: 60 tablet, Rfl: 2   NIFEdipine (ADALAT CC) 60 MG 24 hr tablet, Take 1 tablet (60 mg total) by mouth daily., Disp: 30 tablet, Rfl: 2   OVER THE COUNTER MEDICATION, Take 1 tablet by mouth daily. Menopause support., Disp: , Rfl:    sertraline (ZOLOFT) 50 MG tablet, Take 1 tablet (50 mg total) by mouth at bedtime., Disp: 30 tablet, Rfl: 5   acetaminophen (PAIN RELIEVER) 325 MG tablet, Take 650 mg by mouth every 6 (six) hours as needed. (Patient not taking: Reported on 10/19/2023), Disp: , Rfl:    fluconazole (DIFLUCAN) 150 MG tablet, Take one tablet by mouth at the onset of symptoms then repeat in 3 days., Disp: 2 tablet, Rfl: 0   folic acid (FOLVITE) 1 MG tablet, Take 1 mg by mouth daily. (Patient not taking: Reported on 10/19/2023), Disp: , Rfl:    Multiple Vitamin (MULTIVITAMIN) tablet, Take 1 tablet by mouth daily. (Patient not taking: Reported on 10/19/2023), Disp: , Rfl:    thiamine (VITAMIN B1) 100 MG tablet, Take 100 mg by mouth daily. (Patient not taking:  Reported on 10/19/2023), Disp: , Rfl:    No Known Allergies   Review of Systems  Constitutional: Negative.   HENT: Negative.    Eyes: Negative.   Respiratory: Negative.    Cardiovascular: Negative.  Negative for chest pain, palpitations and leg swelling.  Gastrointestinal: Negative.   Musculoskeletal: Negative.   Psychiatric/Behavioral:  The patient is nervous/anxious.      Today's Vitals   10/19/23 1100  BP: 100/60  Pulse: 69  Temp: 98.2 F (36.8 C)  TempSrc: Oral  Weight: 146 lb (66.2 kg)  Height: 5\' 7"  (1.702 m)  PainSc: 0-No pain   Body mass index is 22.87 kg/m.  Wt Readings from Last 3 Encounters:  10/19/23 146 lb (66.2 kg)  10/09/23 150 lb 9.6 oz (68.3 kg)  09/30/23 141 lb 8 oz (64.2 kg)    The 10-year ASCVD risk score (Arnett DK, et al., 2019) is: 0.3%   Values used to calculate the score:     Age: 63 years     Sex: Female     Is Non-Hispanic African American: Yes     Diabetic: No     Tobacco  smoker: No     Systolic Blood Pressure: 100 mmHg     Is BP treated: Yes     HDL Cholesterol: 67 mg/dL     Total Cholesterol: 170 mg/dL  Objective:  Physical Exam HENT:     Head: Normocephalic.  Pulmonary:     Effort: Pulmonary effort is normal.  Skin:    General: Skin is warm and dry.  Neurological:     General: No focal deficit present.     Mental Status: She is alert and oriented to person, place, and time.         Assessment And Plan:  Splenic lesion Assessment & Plan: Followed by Oncology, PET scan 10/20/2023   Establishing care with new doctor, encounter for  Primary hypertension Assessment & Plan: Continue metoprolol 50 twice every day, Nifedipine 60 mg every day    GAD (generalized anxiety disorder) Assessment & Plan: Start sertraline 50 mg every day   Orders: -     Sertraline HCl; Take 1 tablet (50 mg total) by mouth at bedtime.  Dispense: 30 tablet; Refill: 5  Patent foramen ovale Assessment & Plan: Discovered by TEE, referred to cardiology  Orders: -     Ambulatory referral to Cardiology    Return in 3 months (on 01/19/2024), or if symptoms worsen or fail to improve, for physical.  Patient was given opportunity to ask questions. Patient verbalized understanding of the plan and was able to repeat key elements of the plan. All questions were answered to their satisfaction.    I, Ellender Hose, NP, have reviewed all documentation for this visit. The documentation on 10/24/2023 for the exam, diagnosis, procedures, and orders are all accurate and complete.    IF YOU HAVE BEEN REFERRED TO A SPECIALIST, IT MAY TAKE 1-2 WEEKS TO SCHEDULE/PROCESS THE REFERRAL. IF YOU HAVE NOT HEARD FROM US/SPECIALIST IN TWO WEEKS, PLEASE GIVE [place] A CALL AT [phone].

## 2023-10-20 ENCOUNTER — Other Ambulatory Visit: Payer: Self-pay

## 2023-10-20 ENCOUNTER — Encounter (HOSPITAL_COMMUNITY)
Admission: RE | Admit: 2023-10-20 | Discharge: 2023-10-20 | Disposition: A | Discharge status: Home / Self Care | Source: Ambulatory Visit | Attending: Physician Assistant | Admitting: Physician Assistant

## 2023-10-20 ENCOUNTER — Encounter (HOSPITAL_COMMUNITY): Payer: Self-pay

## 2023-10-20 DIAGNOSIS — D7389 Other diseases of spleen: Secondary | ICD-10-CM | POA: Diagnosis present

## 2023-10-20 LAB — GLUCOSE, CAPILLARY: Glucose-Capillary: 102 mg/dL — ABNORMAL HIGH (ref 70–99)

## 2023-10-20 MED ORDER — FLUCONAZOLE 150 MG PO TABS: ORAL_TABLET | ORAL | 0 refills | Status: DC

## 2023-10-20 MED ORDER — FLUDEOXYGLUCOSE F - 18 (FDG) INJECTION
7.1900 | Freq: Once | INTRAVENOUS | Status: AC
  Administered 2023-10-20: 7.19 via INTRAVENOUS

## 2023-10-24 DIAGNOSIS — F411 Generalized anxiety disorder: Secondary | ICD-10-CM | POA: Insufficient documentation

## 2023-10-24 DIAGNOSIS — I10 Essential (primary) hypertension: Secondary | ICD-10-CM | POA: Insufficient documentation

## 2023-10-24 DIAGNOSIS — Z7689 Persons encountering health services in other specified circumstances: Secondary | ICD-10-CM | POA: Insufficient documentation

## 2023-10-24 DIAGNOSIS — Q2112 Patent foramen ovale: Secondary | ICD-10-CM | POA: Insufficient documentation

## 2023-10-24 NOTE — Assessment & Plan Note (Signed)
 Continue metoprolol 50 twice every day, Nifedipine 60 mg every day

## 2023-10-24 NOTE — Assessment & Plan Note (Signed)
 Discovered by TEE, referred to cardiology

## 2023-10-24 NOTE — Assessment & Plan Note (Addendum)
 Followed by Oncology, PET scan 10/20/2023

## 2023-10-24 NOTE — Assessment & Plan Note (Signed)
 Start sertraline 50 mg every day

## 2023-10-26 ENCOUNTER — Inpatient Hospital Stay: Admitting: Internal Medicine

## 2023-10-26 ENCOUNTER — Telehealth: Payer: Self-pay | Admitting: Physician Assistant

## 2023-10-26 DIAGNOSIS — E079 Disorder of thyroid, unspecified: Secondary | ICD-10-CM

## 2023-10-26 NOTE — Telephone Encounter (Signed)
 I notified [name] by phone regarding PET scan results.  Scan shows that spleen is normal.  No abnormal metabolic activity seen.  There is also no lymphadenopathy in the abdomen or pelvis.  Radiologist does comment on hypermetabolic lesion in the right lobe of the thyroid gland.  Discussed with patient we will need to evaluate this further with an ultrasound.  This has been ordered and patient knows to call and schedule it.  I will follow-up with patient once those results are in.  She will continue to take the antibiotics as prescribed by infectious disease and follow-up with them as planned. All of patient's questions were answered and she expressed understanding of the plan provided.

## 2023-10-27 ENCOUNTER — Telehealth: Payer: Self-pay

## 2023-10-27 NOTE — Telephone Encounter (Signed)
 Patient left message requesting assistance with scheduling recently ordered ultrasound.  Left voicemail providing patient with number to schedule at her convenience.  Provided callback number should patient have any questions or concerns.

## 2023-11-03 ENCOUNTER — Ambulatory Visit
Admission: RE | Admit: 2023-11-03 | Discharge: 2023-11-03 | Disposition: A | Discharge status: Home / Self Care | Source: Ambulatory Visit | Attending: Physician Assistant | Admitting: Physician Assistant

## 2023-11-03 DIAGNOSIS — E079 Disorder of thyroid, unspecified: Secondary | ICD-10-CM

## 2023-11-07 ENCOUNTER — Telehealth: Payer: Self-pay | Admitting: Physician Assistant

## 2023-11-07 ENCOUNTER — Encounter: Payer: Self-pay | Admitting: Physician Assistant

## 2023-11-07 NOTE — Telephone Encounter (Signed)
 Attempted to call patient x 2 without answer to discuss her thyroid ultrasound results.  MyChart message also sent requesting patient call back.  Will continue to try to reach patient.

## 2023-11-08 ENCOUNTER — Telehealth: Payer: Self-pay | Admitting: Physician Assistant

## 2023-11-08 NOTE — Telephone Encounter (Signed)
 Attempted to call patient x2 to discuss US  thyroid results. Unable to reach patient. Will continue to try.

## 2023-11-10 ENCOUNTER — Other Ambulatory Visit: Payer: Self-pay | Admitting: Family Medicine

## 2023-11-10 DIAGNOSIS — F411 Generalized anxiety disorder: Secondary | ICD-10-CM

## 2023-11-13 ENCOUNTER — Telehealth: Payer: Self-pay | Admitting: Physician Assistant

## 2023-11-13 NOTE — Telephone Encounter (Signed)
 Attempted to call patient to discuss thyroid  US  results without being able to reach her multiple times today and last week.  Chart review shows that patient is established with a PCP. We will fax [facility] workup to PCP as well as our recommendations. Per discussion with Dr. Rosaline Coma because the thyroid  US  shows a 1.6 cm nodule an US  in 1 year is recommended. Because this nodule demonstrated FDG activity on recent PET scan we recommend TSH and T4 lab testing now to rule out any thyroid  dysfunction. Both the US  and labs can be ordered/followed by PCP. We will mail patient a letter of our recommendations as well. No further oncology work up is needed at this time.

## 2023-11-14 ENCOUNTER — Encounter: Payer: Self-pay | Admitting: Medical Oncology

## 2023-11-14 NOTE — Progress Notes (Signed)
 [facility]  Patient returned message left by Angeline Kemps. PA-C. During this call, I provided patient with the information that Polly Brink had asked me to, as well as what has been already documented. Patient gave her verbal understanding and confirmed she will follow-up with NP Melodie Spry. Patient did ask about whether she needs to continue with the Augmentin  prescribed to her. Patient states that she is having a root canal at the end of this week, states she's been on the antibiotic for quite some time and asking whether she needs to continue with it. I informed her that Polly Brink is out of the office today, but I will forward this message to her to respond to. Patient denies further questions at this time. Patient thanked and encouraged to call with further questions/concerns.   Diagnostic workup and handoff faxed to Melodie Spry NP.  Shari Hedges, RN, BSN, [facility] Oncology Nurse Navigator, [facility] 11/14/2023 10:08 AM

## 2023-11-15 ENCOUNTER — Encounter: Payer: Self-pay | Admitting: Medical Oncology

## 2023-11-15 NOTE — Progress Notes (Signed)
 Outgoing call to patient regarding her question on how long she needs to continue with antibiotic prescribed to her when in the ED. Reviewed with Devonda Folds, and informed patient she needs to follow-up with infections disease. I provided patient with the name and number of where to call. Patient gave verbal understanding.   Esperanza Hedges, RN, BSN, [facility] Oncology Nurse Navigator, [facility] 11/15/2023 2:45 PM

## 2023-12-28 ENCOUNTER — Encounter: Admitting: Family Medicine

## 2024-02-05 NOTE — Progress Notes (Deleted)
   Cardiology Office Note    Date:  02/05/2024  ID:  [name], DOB 1980-08-12, MRN [id number] PCP:  Petrina Pries, NP  Cardiologist:  None - New  Chief Complaint: ***  History of Present Illness: .    [name] is a 43 y.o. female with visit-pertinent history of ADHD, depression, PFO, HTN during recent admission for splenic lesions, thyroid  nodule seen for evaluation of PFO at the request of Pries Petrina, NP. She was admitted to the hospital in 09/2022 with n/v and fatigue following recent dental abscess/facial cellulitis debridement, found to have splenic lesions (question infection vs neoplasm) and hypertensive urgency. Splenic lesions were felt most consistent with infection but could not rule out malignancy. The lesions were too small for aspiration. 2D echo showed EF 60-65%, abnl septal motion, moderate LVH, TEE showed normal LVEF/RFO, trace TR, trace MR/ mild PI, small PFO present. She was to follow with ID/onc as outpatient. PET scan was negative for abnormal metabolic activity in the spleen. A thyroid  ultrasound showed a thyroid  nodule with recommendation for follow-up 1 year but with FDG activity, clinical correlation recommended - defer to ordering provider on this.  Needs EKG Low mg/ ? TSH   Family History: Tobacco: Alcohol: Drug use:  PFO Moderate LVH Abnormal septal motion by echo Recent high blood pressure without formal diagnosis of HTN   Labwork independently reviewed: 09/2023 Hgb OK, plt 456, K 4.3, Cr 0.97, LFTs ok, MG 1.6 2022 TSH OK  ROS: .    Please see the history of present illness. Otherwise, review of systems is positive for ***.  All other systems are reviewed and otherwise negative.  Studies Reviewed: SABRA    EKG:  EKG is ordered today, personally reviewed, demonstrating ***  CV Studies: Cardiac studies reviewed are outlined and summarized above. Otherwise please see EMR for full report.   Current Reported Medications:.    No  outpatient medications have been marked as taking for the 02/06/24 encounter (Appointment) with Sam Overbeck N, PA-C.    Physical Exam:    VS:  There were no vitals taken for this visit.   Wt Readings from Last 3 Encounters:  10/19/23 146 lb (66.2 kg)  10/09/23 150 lb 9.6 oz (68.3 kg)  09/30/23 141 lb 8 oz (64.2 kg)    GEN: Well nourished, well developed in no acute distress NECK: No JVD; No carotid bruits CARDIAC: ***RRR, no murmurs, rubs, gallops RESPIRATORY:  Clear to auscultation without rales, wheezing or rhonchi  ABDOMEN: Soft, non-tender, non-distended EXTREMITIES:  No edema; No acute deformity   Asessement and Plan:.     ***     Disposition: F/u with ***  ***[id number]***  Signed, Waddell Iten N Kethan Papadopoulos, PA-C

## 2024-02-06 ENCOUNTER — Ambulatory Visit: Attending: Physician Assistant | Admitting: Physician Assistant

## 2024-02-06 DIAGNOSIS — R931 Abnormal findings on diagnostic imaging of heart and coronary circulation: Secondary | ICD-10-CM

## 2024-02-06 DIAGNOSIS — I517 Cardiomegaly: Secondary | ICD-10-CM

## 2024-02-06 DIAGNOSIS — Q2112 Patent foramen ovale: Secondary | ICD-10-CM

## 2024-02-06 DIAGNOSIS — R030 Elevated blood-pressure reading, without diagnosis of hypertension: Secondary | ICD-10-CM

## 2024-02-15 ENCOUNTER — Encounter: Payer: Self-pay | Admitting: Family Medicine

## 2024-02-15 ENCOUNTER — Ambulatory Visit (INDEPENDENT_AMBULATORY_CARE_PROVIDER_SITE_OTHER): Admitting: Family Medicine

## 2024-02-15 VITALS — BP 110/80 | HR 79 | Temp 98.9°F | Ht 67.0 in | Wt 159.0 lb

## 2024-02-15 DIAGNOSIS — Z136 Encounter for screening for cardiovascular disorders: Secondary | ICD-10-CM

## 2024-02-15 DIAGNOSIS — I10 Essential (primary) hypertension: Secondary | ICD-10-CM | POA: Diagnosis not present

## 2024-02-15 DIAGNOSIS — Z1231 Encounter for screening mammogram for malignant neoplasm of breast: Secondary | ICD-10-CM

## 2024-02-15 DIAGNOSIS — F411 Generalized anxiety disorder: Secondary | ICD-10-CM

## 2024-02-15 DIAGNOSIS — Z0000 Encounter for general adult medical examination without abnormal findings: Secondary | ICD-10-CM | POA: Diagnosis not present

## 2024-02-15 DIAGNOSIS — E559 Vitamin D deficiency, unspecified: Secondary | ICD-10-CM

## 2024-02-15 DIAGNOSIS — F902 Attention-deficit hyperactivity disorder, combined type: Secondary | ICD-10-CM

## 2024-02-15 LAB — POCT URINALYSIS DIP (CLINITEK)
Bilirubin, UA: NEGATIVE
Glucose, UA: NEGATIVE mg/dL
Ketones, POC UA: NEGATIVE mg/dL
Leukocytes, UA: NEGATIVE
Nitrite, UA: NEGATIVE
POC PROTEIN,UA: 30 — AB
Spec Grav, UA: 1.015 (ref 1.010–1.025)
Urobilinogen, UA: 1 U/dL
pH, UA: 7 (ref 5.0–8.0)

## 2024-02-15 MED ORDER — NIFEDIPINE ER 60 MG PO TB24: 60.0000 mg | ORAL_TABLET | Freq: Every day | ORAL | 2 refills | Status: AC

## 2024-02-15 MED ORDER — ATOMOXETINE HCL 40 MG PO CAPS: 40.0000 mg | ORAL_CAPSULE | Freq: Every day | ORAL | 2 refills | Status: DC

## 2024-02-15 MED ORDER — METOPROLOL TARTRATE 50 MG PO TABS: 50.0000 mg | ORAL_TABLET | Freq: Every day | ORAL | 2 refills | Status: AC

## 2024-02-15 NOTE — Progress Notes (Addendum)
 I,Jameka J Llittleton, CMA,acting as a Neurosurgeon for [employer], NP.,have documented all relevant documentation on the behalf of Shari Creighton, NP,as directed by  Shari Creighton, NP while in the presence of Shari Creighton, NP.  Subjective:    Patient ID: [name] , female    DOB: 1981-06-15 , 43 y.o.   MRN: [id number]  Chief Complaint  Patient presents with   Annual Exam    Patient presents today for a physical. Patient reports compliance with her meds. Patient denies having chest pain, headaches or sob at this time. Patient would like to get some medicine to help her stop smoking.    HPI Discussed the use of AI scribe software for clinical note transcription with the patient, who gave verbal consent to proceed.  History of Present Illness     [name] is a 43 year old female who presents for her annual  physical exam and to follow-up on previous health concerns.  She previously underwent a PET scan due to concerns about a lesion, which was found to be non-cancerous. She was treated with antibiotics for over a month for a possible infectious disease. She is no longer on antibiotics and is unsure about the current status of the lesions.  She has a history of high blood pressure and was previously on medication for it. She stopped taking her blood pressure medication about two months ago because her blood pressure readings were low. She has not been monitoring her blood pressure regularly but reports that it was good during today's visit.  She wants to gain weight and has been taking a supplement for about two weeks, which has increased her appetite. She is eating more calorie-dense foods to gain weight and mentions that her weight has increased from 146 to 159 pounds.  She has a history of anxiety and ADHD. She did not fill a previous prescription for anxiety medication due to concerns about interactions with other medications. She experiences mood swings and irritability,  particularly with her children, and is interested in starting therapy. She was diagnosed with ADHD as a child and was on Ritalin and another time-release medication during high school. She has not been on ADHD medication since then.  She recently started taking probiotics for gut health. 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  She reports no difficulty unwinding and relaxing. She reports feeling overly active or compelled to do things internally, but does not physically act on it.  Will refer for Integrated Behavioral health Services. She voiced understanding and agreed.   Patient/Guardian was advised Release of Information must be obtained prior to any record release in order to collaborate their care with an outside provider. Patient/Guardian was advised if they have not already done so to contact the registration department to sign all necessary forms in order for us  to release information regarding their care.   Consent: Patient/Guardian gives verbal consent for treatment and assignment of benefits for services provided during this visit. Patient/Guardian expressed understanding and agreed to proceed.        Past Medical History:  Diagnosis Date   ADHD (attention deficit hyperactivity disorder)    Anemia    Depression    Headache      Family History  Problem Relation Age of Onset   Asthma Sister    Depression Sister    Asthma Maternal Grandmother    Drug abuse Other  Alcohol abuse Neg Hx    Arthritis Neg Hx    Birth defects Neg Hx    Cancer Neg Hx    COPD Neg Hx    Diabetes Neg Hx    Hypertension Neg Hx    Hyperlipidemia Neg Hx    Hearing loss Neg Hx    Early death Neg Hx    Heart disease Neg Hx    Kidney disease Neg Hx    Mental illness Neg Hx    Mental retardation Neg Hx    Learning disabilities Neg Hx    Miscarriages / Stillbirths Neg Hx    Stroke Neg Hx    Vision loss  Neg Hx    Varicose Veins Neg Hx      Current Outpatient Medications:    atomoxetine  (STRATTERA ) 40 MG capsule, Take 1 capsule (40 mg total) by mouth daily., Disp: 30 capsule, Rfl: 2   folic acid (FOLVITE) 1 MG tablet, Take 1 mg by mouth daily., Disp: , Rfl:    NON FORMULARY, Take 1 capsule by mouth daily., Disp: , Rfl:    NON FORMULARY, Take 1 mL by mouth daily at 6 (six) AM., Disp: , Rfl:    OVER THE COUNTER MEDICATION, Take 1 tablet by mouth daily. Menopause support., Disp: , Rfl:    amoxicillin -clavulanate (AUGMENTIN ) 875-125 MG tablet, Take 1 tablet by mouth every 12 (twelve) hours. (Patient not taking: Reported on 02/15/2024), Disp: 84 tablet, Rfl: 0   metoprolol  tartrate (LOPRESSOR ) 50 MG tablet, Take 1 tablet (50 mg total) by mouth daily., Disp: 90 tablet, Rfl: 2   Multiple Vitamin (MULTIVITAMIN) tablet, Take 1 tablet by mouth daily. (Patient not taking: Reported on 02/15/2024), Disp: , Rfl:    NIFEdipine  (ADALAT  CC) 60 MG 24 hr tablet, Take 1 tablet (60 mg total) by mouth daily., Disp: 90 tablet, Rfl: 2   sertraline  (ZOLOFT ) 50 MG tablet, Take 1 tablet (50 mg total) by mouth at bedtime. (Patient not taking: Reported on 02/15/2024), Disp: 30 tablet, Rfl: 5   thiamine (VITAMIN B1) 100 MG tablet, Take 100 mg by mouth daily. (Patient not taking: Reported on 02/15/2024), Disp: , Rfl:    No Known Allergies     Social History   Tobacco Use  Smoking Status Former   Current packs/day: 0.00   Types: Cigarettes   Quit date: 12/28/2013   Years since quitting: 10.1  Smokeless Tobacco Never   Social History   Substance and Sexual Activity  Alcohol Use No     Review of Systems  Constitutional: Negative.   HENT: Negative.    Eyes: Negative.   Respiratory: Negative.    Cardiovascular: Negative.   Gastrointestinal: Negative.   Endocrine: Negative.   Genitourinary: Negative.   Musculoskeletal: Negative.   Skin: Negative.   Allergic/Immunologic: Negative.   Neurological: Negative.    Hematological: Negative.   Psychiatric/Behavioral:  Positive for agitation and behavioral problems. The patient is nervous/anxious and is hyperactive.      Today's Vitals   02/15/24 1046  BP: 110/80  Pulse: 79  Temp: 98.9 F (37.2 C)  TempSrc: Oral  Weight: 159 lb (72.1 kg)  Height: 5' 7 (1.702 m)  PainSc: 0-No pain   Body mass index is 24.9 kg/m.  Wt Readings from Last 3 Encounters:  02/15/24 159 lb (72.1 kg)  10/19/23 146 lb (66.2 kg)  10/09/23 150 lb 9.6 oz (68.3 kg)     Objective:  Physical Exam Constitutional:      Appearance: Normal appearance.  HENT:     Head: Normocephalic.  Cardiovascular:     Rate and Rhythm: Normal rate and regular rhythm.     Pulses: Normal pulses.     Heart sounds: Normal heart sounds.  Pulmonary:     Effort: Pulmonary effort is normal.     Breath sounds: Normal breath sounds.  Abdominal:     General: Bowel sounds are normal.  Musculoskeletal:        General: Normal range of motion.  Skin:    General: Skin is warm and dry.  Neurological:     General: No focal deficit present.     Mental Status: She is alert and oriented to person, place, and time. Mental status is at baseline.  Psychiatric:        Mood and Affect: Mood is anxious.        Behavior: Behavior is hyperactive.         Assessment And Plan:     Encounter for general adult medical examination w/o abnormal findings  Primary hypertension Assessment & Plan: Blood pressure well-controlled at 110/80 mmHg.- Refill antihypertensive medication for pharmacy availability.  Orders: -     Microalbumin / creatinine urine ratio -     POCT URINALYSIS DIP (CLINITEK) -     EKG 12-Lead -     CBC -     CMP14+EGFR -     Metoprolol  Tartrate; Take 1 tablet (50 mg total) by mouth daily.  Dispense: 90 tablet; Refill: 2 -     NIFEdipine  ER; Take 1 tablet (60 mg total) by mouth daily.  Dispense: 90 tablet; Refill: 2  GAD (generalized anxiety disorder) Assessment &  Plan:  Generalized anxiety disorder with mood swings and irritability.  - Encourage her to start taking zoloft  50 mg prescription that she got last vist and referred her to integrated behavioral health for evaluation.  Orders: -     Amb ref to Integrated Behavioral Health  Vitamin D  deficiency -     VITAMIN D  25 Hydroxy (Vit-D Deficiency, Fractures)  ADHD (attention deficit hyperactivity disorder), combined type -     Atomoxetine  HCl; Take 1 capsule (40 mg total) by mouth daily.  Dispense: 30 capsule; Refill: 2 -     Amb ref to Integrated Behavioral Health  Encounter for screening for cardiovascular disorders -     Lipid panel  Screening mammogram for breast cancer     Assessment & Plan Hypertension  Previous elevation likely due to recent illness.  - Instruct her to monitor blood pressure weekly and report readings.     Attention deficit hyperactivity disorder (ADHD) ADHD with procrastination and lack of motivation. Discussed Strattera  as a non-stimulant option. - Prescribe Strattera  for ADHD management. - Advise her to take Strattera  with food to minimize gastrointestinal side effects. - Schedule follow-up in one month to assess medication efficacy.   General Health Maintenance Not engaging in regular exercise or healthy eating habits. Emphasized exercise for health and weight management. - Encourage regular exercise to manage weight and improve health.   Follow-up needed to assess new medication efficacy and monitor blood pressure. Laboratory tests ordered to evaluate overall health. - Schedule follow-up appointment in one month to evaluate ADHD and anxiety medication effects. - Order laboratory tests including CBC, CMP, kidney function, liver function, cholesterol, and vitamin D  levels.   Return for 1 year physical, 1 month, ADHD. Patient was given opportunity to ask questions. Patient verbalized understanding of the plan and was able to repeat key elements  of the  plan. All questions were answered to their satisfaction.   I, Shari Creighton, NP, have reviewed all documentation for this visit. The documentation on 02/26/2024 for the exam, diagnosis, procedures, and orders are all accurate and complete.

## 2024-02-15 NOTE — Patient Instructions (Signed)

## 2024-02-16 LAB — CMP14+EGFR
ALT: 13 IU/L (ref 0–32)
AST: 19 IU/L (ref 0–40)
Albumin: 4.4 g/dL (ref 3.9–4.9)
Alkaline Phosphatase: 74 IU/L (ref 44–121)
BUN/Creatinine Ratio: 9 (ref 9–23)
BUN: 8 mg/dL (ref 6–24)
Bilirubin Total: 0.4 mg/dL (ref 0.0–1.2)
CO2: 22 mmol/L (ref 20–29)
Calcium: 9.1 mg/dL (ref 8.7–10.2)
Chloride: 104 mmol/L (ref 96–106)
Creatinine, Ser: 0.86 mg/dL (ref 0.57–1.00)
Globulin, Total: 3 g/dL (ref 1.5–4.5)
Glucose: 74 mg/dL (ref 70–99)
Potassium: 3.9 mmol/L (ref 3.5–5.2)
Sodium: 142 mmol/L (ref 134–144)
Total Protein: 7.4 g/dL (ref 6.0–8.5)
eGFR: 86 mL/min/1.73 (ref >59)

## 2024-02-16 LAB — LIPID PANEL
Chol/HDL Ratio: 2.9 ratio (ref 0.0–4.4)
Cholesterol, Total: 156 mg/dL (ref 100–199)
HDL: 54 mg/dL (ref >39)
LDL Chol Calc (NIH): 88 mg/dL (ref 0–99)
Triglycerides: 70 mg/dL (ref 0–149)
VLDL Cholesterol Cal: 14 mg/dL (ref 5–40)

## 2024-02-16 LAB — CBC
Hematocrit: 39.1 % (ref 34.0–46.6)
Hemoglobin: 12.7 g/dL (ref 11.1–15.9)
MCH: 30.8 pg (ref 26.6–33.0)
MCHC: 32.5 g/dL (ref 31.5–35.7)
MCV: 95 fL (ref 79–97)
Platelets: 303 x10E3/uL (ref 150–450)
RBC: 4.12 x10E6/uL (ref 3.77–5.28)
RDW: 15.2 % (ref 11.7–15.4)
WBC: 5.1 x10E3/uL (ref 3.4–10.8)

## 2024-02-16 LAB — MICROALBUMIN / CREATININE URINE RATIO
Creatinine, Urine: 118.8 mg/dL
Microalb/Creat Ratio: 118 mg/g{creat} — ABNORMAL HIGH (ref 0–29)
Microalbumin, Urine: 139.7 ug/mL

## 2024-02-16 LAB — VITAMIN D 25 HYDROXY (VIT D DEFICIENCY, FRACTURES): Vit D, 25-Hydroxy: 19.3 ng/mL — ABNORMAL LOW (ref 30.0–100.0)

## 2024-02-26 ENCOUNTER — Ambulatory Visit: Payer: Self-pay | Admitting: Family Medicine

## 2024-02-26 DIAGNOSIS — Z136 Encounter for screening for cardiovascular disorders: Secondary | ICD-10-CM | POA: Insufficient documentation

## 2024-02-26 DIAGNOSIS — F902 Attention-deficit hyperactivity disorder, combined type: Secondary | ICD-10-CM | POA: Insufficient documentation

## 2024-02-26 DIAGNOSIS — Z1231 Encounter for screening mammogram for malignant neoplasm of breast: Secondary | ICD-10-CM | POA: Insufficient documentation

## 2024-02-26 DIAGNOSIS — Z0000 Encounter for general adult medical examination without abnormal findings: Secondary | ICD-10-CM | POA: Insufficient documentation

## 2024-02-26 DIAGNOSIS — E559 Vitamin D deficiency, unspecified: Secondary | ICD-10-CM | POA: Insufficient documentation

## 2024-02-26 MED ORDER — VITAMIN D (ERGOCALCIFEROL) 1.25 MG (50000 UNIT) PO CAPS: 50000.0000 [IU] | ORAL_CAPSULE | ORAL | 2 refills | Status: AC

## 2024-02-26 MED ORDER — VALSARTAN 80 MG PO TABS: 80.0000 mg | ORAL_TABLET | Freq: Every day | ORAL | 11 refills | Status: AC

## 2024-02-26 NOTE — Assessment & Plan Note (Signed)
 Blood pressure well-controlled at 110/80 mmHg.- Refill antihypertensive medication for pharmacy availability.

## 2024-02-26 NOTE — Assessment & Plan Note (Signed)
  Generalized anxiety disorder with mood swings and irritability.  - Encourage her to start taking zoloft  50 mg prescription that she got last vist and referred her to integrated behavioral health for evaluation.

## 2024-03-09 ENCOUNTER — Other Ambulatory Visit: Payer: Self-pay | Admitting: Family Medicine

## 2024-03-09 DIAGNOSIS — F902 Attention-deficit hyperactivity disorder, combined type: Secondary | ICD-10-CM

## 2024-03-15 ENCOUNTER — Encounter: Payer: Self-pay | Admitting: Family Medicine

## 2024-03-15 ENCOUNTER — Ambulatory Visit: Admitting: Family Medicine

## 2024-03-15 VITALS — BP 110/80 | HR 75 | Temp 98.2°F | Ht 67.0 in | Wt 156.0 lb

## 2024-03-15 DIAGNOSIS — I10 Essential (primary) hypertension: Secondary | ICD-10-CM

## 2024-03-15 DIAGNOSIS — F902 Attention-deficit hyperactivity disorder, combined type: Secondary | ICD-10-CM

## 2024-03-15 DIAGNOSIS — R0981 Nasal congestion: Secondary | ICD-10-CM

## 2024-03-15 DIAGNOSIS — F411 Generalized anxiety disorder: Secondary | ICD-10-CM | POA: Diagnosis not present

## 2024-03-15 DIAGNOSIS — J029 Acute pharyngitis, unspecified: Secondary | ICD-10-CM

## 2024-03-15 LAB — POC COVID19 BINAXNOW: SARS Coronavirus 2 Ag: NEGATIVE

## 2024-03-15 MED ORDER — ATOMOXETINE HCL 40 MG PO CAPS: 40.0000 mg | ORAL_CAPSULE | Freq: Every day | ORAL | 5 refills | Status: AC

## 2024-03-15 NOTE — Patient Instructions (Signed)

## 2024-03-15 NOTE — Progress Notes (Signed)
 I,Jameka J Llittleton, CMA,acting as a Neurosurgeon for [employer], NP.,have documented all relevant documentation on the behalf of [name], NP,as directed by  [name], NP while in the presence of [name], NP.  Subjective:  Patient ID: [name] , female    DOB: 05/30/1981 , 43 y.o.   MRN: [id number]  Chief Complaint  Patient presents with   ADHD    Patient presents today for ADHD f/u Patients reports compliance with her med. She feels like it was working really well for her.     HPI Discussed the use of AI scribe software for clinical note transcription with the patient, who gave verbal consent to proceed.  History of Present Illness     [name] is a 43 year old female with ADHD who presents for a follow-up visit for ADHD management.  She has been taking Strattera  for ADHD, which initially caused nausea and vomiting for the first two days, but these symptoms resolved after taking it with food. She did not take the medication during a recent trip to [place] and the [place] but resumed upon returning. The medication helps her stay on task at work, reducing her tendency to take voluntary time off.  Upon returning from her trip, she experienced a sore throat, head congestion, and cold symptoms. She reports attending a wedding abroad and being around many people during her trip. She reports that she was tested for COVID-19 and was told it was not COVID. She uses Afrin nasal spray at night to relieve nasal congestion.  She has a history of high blood pressure but is not currently on medication for it. She has not been monitoring her blood pressure and inquires about which blood pressure reading should be monitored closely.  She discusses her past mammogram and the discovery of lymph nodes, which resulted in a $700 bill. She plans to contact her OB GYN to arrange a payment plan and follow-up. She has not had a Pap smear recently and is unsure of the last time it  was done.      Past Medical History:  Diagnosis Date   ADHD (attention deficit hyperactivity disorder)    Anemia    Depression    Headache      Family History  Problem Relation Age of Onset   Asthma Sister    Depression Sister    Asthma Maternal Grandmother    Drug abuse Other    Alcohol abuse Neg Hx    Arthritis Neg Hx    Birth defects Neg Hx    Cancer Neg Hx    COPD Neg Hx    Diabetes Neg Hx    Hypertension Neg Hx    Hyperlipidemia Neg Hx    Hearing loss Neg Hx    Early death Neg Hx    Heart disease Neg Hx    Kidney disease Neg Hx    Mental illness Neg Hx    Mental retardation Neg Hx    Learning disabilities Neg Hx    Miscarriages / Stillbirths Neg Hx    Stroke Neg Hx    Vision loss Neg Hx    Varicose Veins Neg Hx      Current Outpatient Medications:    folic acid (FOLVITE) 1 MG tablet, Take 1 mg by mouth daily., Disp: , Rfl:    metoprolol  tartrate (LOPRESSOR ) 50 MG tablet, Take 1 tablet (50 mg total) by mouth daily., Disp: 90 tablet, Rfl: 2   NIFEdipine  (ADALAT  CC) 60 MG  24 hr tablet, Take 1 tablet (60 mg total) by mouth daily., Disp: 90 tablet, Rfl: 2   NON FORMULARY, Take 1 capsule by mouth daily., Disp: , Rfl:    NON FORMULARY, Take 1 mL by mouth daily at 6 (six) AM., Disp: , Rfl:    OVER THE COUNTER MEDICATION, Take 1 tablet by mouth daily. Menopause support., Disp: , Rfl:    sertraline  (ZOLOFT ) 50 MG tablet, Take 1 tablet (50 mg total) by mouth at bedtime., Disp: 30 tablet, Rfl: 5   thiamine (VITAMIN B1) 100 MG tablet, Take 100 mg by mouth daily., Disp: , Rfl:    valsartan  (DIOVAN ) 80 MG tablet, Take 1 tablet (80 mg total) by mouth daily., Disp: 30 tablet, Rfl: 11   Vitamin D , Ergocalciferol , (DRISDOL ) 1.25 MG (50000 UNIT) CAPS capsule, Take 1 capsule (50,000 Units total) by mouth every 7 (seven) days., Disp: 12 capsule, Rfl: 2   amoxicillin -clavulanate (AUGMENTIN ) 875-125 MG tablet, Take 1 tablet by mouth every 12 (twelve) hours. (Patient not taking:  Reported on 03/15/2024), Disp: 84 tablet, Rfl: 0   atomoxetine  (STRATTERA ) 40 MG capsule, Take 1 capsule (40 mg total) by mouth daily., Disp: 30 capsule, Rfl: 5   Multiple Vitamin (MULTIVITAMIN) tablet, Take 1 tablet by mouth daily. (Patient not taking: Reported on 03/15/2024), Disp: , Rfl:    No Known Allergies   Review of Systems  Constitutional: Negative.   HENT:  Positive for congestion.   Respiratory: Negative.    Neurological: Negative.   Psychiatric/Behavioral:  The patient is nervous/anxious and is hyperactive.      Today's Vitals   03/15/24 1128  BP: 110/80  Pulse: 75  Temp: 98.2 F (36.8 C)  TempSrc: Oral  Weight: 156 lb (70.8 kg)  Height: 5' 7 (1.702 m)  PainSc: 0-No pain   Body mass index is 24.43 kg/m.  Wt Readings from Last 3 Encounters:  03/15/24 156 lb (70.8 kg)  02/15/24 159 lb (72.1 kg)  10/19/23 146 lb (66.2 kg)    The 10-year ASCVD risk score (Arnett DK, et al., 2019) is: 0.7%   Values used to calculate the score:     Age: 67 years     Clincally relevant sex: Female     Is Non-Hispanic African American: Yes     Diabetic: No     Tobacco smoker: No     Systolic Blood Pressure: 110 mmHg     Is BP treated: Yes     HDL Cholesterol: 54 mg/dL     Total Cholesterol: 156 mg/dL  Objective:  Physical Exam HENT:     Head: Normocephalic.  Cardiovascular:     Rate and Rhythm: Normal rate.  Pulmonary:     Effort: Pulmonary effort is normal.  Skin:    General: Skin is warm and dry.  Neurological:     General: No focal deficit present.     Mental Status: She is alert and oriented to person, place, and time.  Psychiatric:        Mood and Affect: Mood normal.         Assessment And Plan:  ADHD (attention deficit hyperactivity disorder), combined type Assessment & Plan: Positive response to Atomoxetine  with improved focus and no adverse effects. Prefers Atomoxetine  over Ritalin. - Provide additional refills for Atomoxetine  (Strattera ) 40 MG oral  daily. - Schedule follow-up in six months to reassess ADHD management.  Orders: -     Atomoxetine  HCl; Take 1 capsule (40 mg total) by mouth daily.  Dispense: 30 capsule; Refill: 5  Nasal congestion -     POC COVID-19 BinaxNow  GAD (generalized anxiety disorder) Assessment & Plan: Refused SSRI, has been referred for IBH counseling.   Primary hypertension Assessment & Plan: Not monitoring blood pressure as advised. Not on antihypertensive medication. Educated on monitoring systolic readings over 130 mmHg. - Advise to monitor blood pressure regularly, focusing on systolic readings.     Assessment & Plan Attention-deficit hyperactivity disorder, combined type Positive response to Atomoxetine  with improved focus and no adverse effects. Prefers Atomoxetine  over Ritalin. - Provide additional refills for Atomoxetine  (Strattera ) 40 MG oral daily. - Schedule follow-up in six months to reassess ADHD management.  Primary hypertension Not monitoring blood pressure as advised. Not on antihypertensive medication. Educated on monitoring systolic readings over 130 mmHg. - Advise to monitor blood pressure regularly, focusing on systolic readings. - Schedule follow-up in four months to reassess blood pressure management.  Acute pharyngitis and nasal congestion Sore throat and nasal congestion post-travel, likely viral. Using Afrin nasal spray for relief. - Advise against long-term use of Afrin nasal spray to avoid rebound congestion. - Encourage hydration and symptomatic management. - Offer Kenalog injection for nasal congestion relief, but she declined.   Return in about 4 months (around 07/15/2024), or if symptoms worsen or fail to improve, for bpc.  Patient was given opportunity to ask questions. Patient verbalized understanding of the plan and was able to repeat key elements of the plan. All questions were answered to their satisfaction.    I, [name], NP, have reviewed all  documentation for this visit. The documentation on 04/06/2024 for the exam, diagnosis, procedures, and orders are all accurate and complete.  .   IF YOU HAVE BEEN REFERRED TO A SPECIALIST, IT MAY TAKE 1-2 WEEKS TO SCHEDULE/PROCESS THE REFERRAL. IF YOU HAVE NOT HEARD FROM US /SPECIALIST IN TWO WEEKS, PLEASE GIVE US  A CALL AT [phone].

## 2024-03-21 ENCOUNTER — Institutional Professional Consult (permissible substitution): Payer: Self-pay | Admitting: Licensed Clinical Social Worker

## 2024-03-21 ENCOUNTER — Telehealth: Payer: Self-pay | Admitting: Licensed Clinical Social Worker

## 2024-03-21 NOTE — Telephone Encounter (Signed)
 Called pt 15 minutes past her appointment time to offer virtual visit or to reschedule visit for today. Pt requested to reschedule her appointment to 9/2@11am  in person.  Appointment rescheduled.

## 2024-03-26 ENCOUNTER — Ambulatory Visit: Payer: Self-pay | Admitting: Licensed Clinical Social Worker

## 2024-03-26 DIAGNOSIS — F4323 Adjustment disorder with mixed anxiety and depressed mood: Secondary | ICD-10-CM

## 2024-03-26 NOTE — BH Specialist Note (Cosign Needed Addendum)
 [place] [facility] Initial  Assessment    Patient/Family location: Home, [facility] Provider location: [facility], [place] All persons participating in visit: [name] , Shari Sparr R Cedar Ditullio, LCSW   I connected with patient and/or family via Telephone or Video Enabled Telemedicine Application  (Video is Caregility application) and verified that I am speaking with the correct person using two identifiers. Discussed confidentiality: Yes   I discussed the limitations of telemedicine and the availability of in person appointments.  Discussed there is a possibility of technology failure and discussed alternative modes of communication if that failure occurs.  I discussed that engaging in this telemedicine visit, they consent to the provision of behavioral healthcare and the services will be billed under their insurance.  Patient and/or legal guardian expressed understanding and consented to Telemedicine visit: Yes     Collaborative Care Initial Assessment  Pt name: [name] MRN# [id number]  Date: 03/26/24   Session Start time 1115  Session End time: 1300 Total time in minutes: 55   Type of Contact:  virtual   Patient consent obtained:  Yes  Patient and/or legal guardian verbally consented to [facility] services about presenting concerns and psychiatric consultation as appropriate.  The services will be billed as appropriate for the patient   Types of Service: Comprehensive Clinical Assessment (CCA) and Collaborative care  Summary  [name] is a 43 y.o.  female with history of ADHD and mood swings seen in consultation at the request of Shari Creighton NP for [facility] collaborative care. Pt is currently taking the following psychiatric medications: straterra 40mg , sertraline  50 mg (not taking sertraline ) .  Current symptoms include poor time management, forgetfulness, irritability, anxiety, stress. Pt reports  that she feels she has had some depression after the birth of her daughter 9 years ago it comes and goes.  Pt denies SI, HI, or AVH at time of session. Pt reports that she currently smokes cigarettes, drinks alcohol and smokes THC daily from the time she gets up until going to bed.  Pt is interested in short term IBH counseling to strengthen coping skills and assist with improving work/life balance.  Reason for referral in patient/family's own words:  I just need to talk to someone   Patient's goal for today's visit: Establish IBH Collaborative Care  History of Present illness:    History of present illness:  [name] reports that they have a history of ADHD, mood swings since childhood and have had the following treatments: Ritalin as a child,.Strattera  as an adult.  Patient reports that she was given prescription for sertraline , but went to pick it up in the pharmacy and the pharmacist told her something that scared her, so she did not take that medication.  Pt reports no major concerns regarding overall medical history.  Pt reports that current external stressors include becoming overwhelmed with all of the external stressors that she is dealing with on a daily basis including balancing work, life, and parenting.  Pt feels that symptoms of stress and anxiety are impacting everyday functioning including becoming more forgetful of things, and feeling overwhelmed when she is triggered by small things. [name] reports that her mother and her sister are primary supports at time of assessment. Pt feels short-term IBH counseling would be something to assist in their overall symptom management.   Clinical Assessments (PHQ-9 and GAD-7)  PHQ-9 Assessments:     03/26/2024   11:45 AM 02/15/2024   10:51  AM  Depression screen PHQ 2/9  Decreased Interest 2 0  Down, Depressed, Hopeless 2 0  PHQ - 2 Score 4 0  Altered sleeping 0 0  Tired, decreased energy 1 0  Change in appetite 3 0   Feeling bad or failure about yourself  3 0  Trouble concentrating 0 0  Moving slowly or fidgety/restless 0 0  Suicidal thoughts 0 0  PHQ-9 Score 11 0  Difficult doing work/chores Not difficult at all Not difficult at all     GAD-7 Assessments:     03/26/2024   11:47 AM 02/15/2024   10:51 AM  GAD 7 : Generalized Anxiety Score  Nervous, Anxious, on Edge 1 0  Control/stop worrying 2 0  Worry too much - different things 2 0  Trouble relaxing 0 0  Restless 0 0  Easily annoyed or irritable 2 0  Afraid - awful might happen 2 0  Total GAD 7 Score 9 0  Anxiety Difficulty Not difficult at all Not difficult at all      Social History:  Household:  mom, sister, two kids Marital status:  single Number of Children:  2 son: 43, daughter 62 Employment:  Programme researcher, 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 Good benefits (travel) Education:  4 year college degree and 2 year college degree  Psychiatric Review of systems: Insomnia: feels sleep deprived Changes in appetite: decreased appetite--lost weight a few years ago Decreased need for sleep: No Family history of bipolar disorder: Yes--mother w/ depression, bipolar Hallucinations: No   Paranoia: No    Psychotropic medications: Strattera  40 mg, sertraline  50 mg (not filled)   Current medications: Current Outpatient Medications on File Prior to Visit  Medication Sig Dispense Refill   amoxicillin -clavulanate (AUGMENTIN ) 875-125 MG tablet Take 1 tablet by mouth every 12 (twelve) hours. (Patient not taking: Reported on 03/15/2024) 84 tablet 0   atomoxetine  (STRATTERA ) 40 MG capsule Take 1 capsule (40 mg total) by mouth daily. 30 capsule 5   folic acid (FOLVITE) 1 MG tablet Take 1 mg by mouth daily.     metoprolol  tartrate (LOPRESSOR ) 50 MG tablet Take 1 tablet (50 mg total) by mouth daily. 90 tablet 2   Multiple Vitamin (MULTIVITAMIN) tablet Take 1 tablet by mouth daily. (Patient not taking: Reported on 03/15/2024)     NIFEdipine  (ADALAT  CC) 60 MG 24  hr tablet Take 1 tablet (60 mg total) by mouth daily. 90 tablet 2   NON FORMULARY Take 1 capsule by mouth daily.     NON FORMULARY Take 1 mL by mouth daily at 6 (six) AM.     OVER THE COUNTER MEDICATION Take 1 tablet by mouth daily. Menopause support.     sertraline  (ZOLOFT ) 50 MG tablet Take 1 tablet (50 mg total) by mouth at bedtime. 30 tablet 5   thiamine (VITAMIN B1) 100 MG tablet Take 100 mg by mouth daily.     valsartan  (DIOVAN ) 80 MG tablet Take 1 tablet (80 mg total) by mouth daily. 30 tablet 11   Vitamin D , Ergocalciferol , (DRISDOL ) 1.25 MG (50000 UNIT) CAPS capsule Take 1 capsule (50,000 Units total) by mouth every 7 (seven) days. 12 capsule 2   No current facility-administered medications on file prior to visit.     Patient taking medications as prescribed:  No--patient is taking Strattera  as prescribed, but is not taking sertraline  Side effects reported: No--patient reported mild side effects when she first started Strattera , but is balanced out currently   Psychiatric History  Have  you ever been treated for a mental health problem? Yes--ADHD  If Yes, when were you treated and whom did you see (psychiatrist/counselor) ?  Primary care provider Have you ever been hospitalized for mental health treatment? No Have you ever been treated for any of the following? Past Psychiatric History/Hospitalization(s): None Anxiety: Yes Bipolar Disorder: No Depression: Yes Mania: No Psychosis: No Schizophrenia: No Personality Disorder: No Hospitalization for psychiatric illness: No History of Electroconvulsive Shock Therapy: No Prior Suicide Attempts: No Have you ever had thoughts of harming yourself or others or attempted suicide? No plan to harm self or others  Traumatic Experiences: History or current traumatic events (natural disaster, house fire, etc.)? no History or current physical trauma?  no History or current emotional trauma?  no History or current sexual trauma?   no History or current domestic or intimate partner violence?  no PTSD symptoms if any traumatic experiences no   Alcohol and/or Substance Use History   Tobacco Alcohol Other substances  Current use cigarettes (AUDIT-C screening) Patient drinks 3-4 margaritas per day THC   Past use Cigarette use Patient reports ongoing daily use of alcohol Patient reports daily use of THC since college graduation  Past treatment Patient denies Patient denies Patient denies   Psychologist, occupational Health from 03/26/2024 in [facility]  AUDIT-C Score 5     Withdrawal Potential: Patient reports that she has not gone multiple days without drinking    Self-harm Behaviors Risk Assessment Self-harm risk factors: None Patient endorses recent thoughts of harming self: Patient denies  [place] Suicide Severity Rating Scale:  Flowsheet Row Integrated Behavioral Health from 03/26/2024 in [facility] ED to Hosp-Admission (Discharged) from 09/29/2023 in [facility] 43M KIDNEY UNIT  C-SSRS RISK CATEGORY No Risk No Risk      The patient demonstrates the following risk factors for suicide: Chronic risk factors for suicide include: psychiatric disorder of depression, anxiety, ADHD and substance use disorder. Acute risk factors for suicide include: social withdrawal/isolation. Protective factors for this patient include: positive social support, positive therapeutic relationship, responsibility to others (children, family), and coping skills. Considering these factors, the overall suicide risk at this point appears to be low. Patient is appropriate for outpatient follow up.  Danger to Others Risk Assessment Danger to others risk factors: None Patient endorses recent thoughts of harming others: None Dynamic Appraisal of Situational Aggression (DASA): None  BH Counselor discussed emergency crisis plan with client and provided local  emergency services resources.  Mental status exam:   General Appearance Siegfried:  Neat Eye Contact:  Good Motor Behavior:  Normal Speech:  Normal Level of Consciousness:  Alert Mood:  Anxious Affect:  Appropriate Anxiety Level:  Minimal Thought Process:  Coherent Thought Content:  WNL Perception:  Normal Judgment:  Good Insight:  Present  Diagnosis:  Encounter Diagnosis  Name Primary?   Adjustment disorder with mixed anxiety and depressed mood Yes      Goals: Increase healthy adjustment to current life circumstances and Decrease self-medicating behaviors   Interventions: Motivational Interviewing and Medication Monitoring   Follow-up Plan: Ongoing IBH supports including short-term counseling and medication consultation and management  Dimple Bastyr R Brenleigh Collet, LCSW  Assessment completed by Tawni Brisker, MSW, LCSW  on 03/26/24

## 2024-03-26 NOTE — Patient Instructions (Signed)
 Using Behavioral Activation to manage stress/depression symptoms    Understand your own depression triggers.  Structure your day--get up around the same time, eat meals/snacks around the same time, go to bed around the same time.  Purposefully schedule self care time and time to complete tasks. This can include quiet time Stimulate your brain--go for a walk, text/call a friend or family member, if you are indoors--go outside (and vice versa), go for a drive, go to a store with bright colors and bright lights. Try to do things in a different way--drive to your favorite places using an alternative route, or instead of starting on the right side of the grocery store when shopping, start on the left side. You might feel a bit uncomfortable doing things outside of the comfort zone, but this is helping the brain create new neural pathways and is very healthy for brain/emotional health.  Physical movement based on your ability. If you can go for a walk, do stretches, even waving your hands to music can trigger feel-good endorphins in the brain and help release physical tension we all hold in our bodies.  Even 5 minutes can make a difference.  Be intentional about doing things that bring you joy (or used to bring you joy), and look for the things in every day that make you happy.  Seek those glimmers of joy each day. Set a timer for 5 minutes for a harder task (ex. Laundry, washing dishes).  Allow yourself to work distraction-free for 5 minutes, then stop when the timer goes off. If you need a break, take a break. If you want to continue working then set another timer for whatever time you choose.  Let in the light!! Open the window blinds, curtains and let natural light in. Even sitting near a window or sitting outside can boost your mood, especially in the wintertime when there is less daylight.    Things to envision for ourselves to to improve inspiration, motivation, and initiative :  improving  physical wellness, focus on family relationships, focusing on our own mental/emotional well being, being a part of a bigger community, finding a hobby, being a part of something that fosters personal growth, engaging socially with others (even digitally!)

## 2024-03-27 ENCOUNTER — Telehealth (INDEPENDENT_AMBULATORY_CARE_PROVIDER_SITE_OTHER): Payer: Self-pay | Admitting: Licensed Clinical Social Worker

## 2024-03-27 DIAGNOSIS — F4323 Adjustment disorder with mixed anxiety and depressed mood: Secondary | ICD-10-CM

## 2024-03-27 NOTE — BH Specialist Note (Signed)
 Attestation signed by Warren Becker, PMHNP, DNP 03/27/2024 7:49 PM   Collaborative Care Psychiatric Consultant Case Review   Assessment/Provisional Diagnosis [name] is a 43 year old female with history of primary HTN, vitamin D  deficiency, hemoglobin C trait, ADHD, depression, and anxiety. The patient is referred for anxiety and depression.  # MDD, Recurrent, moderate   Recommendation 1.  IBH specialist to follow up with therapy. 2.  Patient declines antidepressant or antianxiety medications.  3.  Continue Strattera  40 mg daily and vitamin D  supplementation.   Thank you for your consult. Please contact our collaborative care team for any questions or concerns.    Medication Management Recommendations: Continue current medication of record: Strattera  50 mg. Pt has prescription for Sertraline  50 that she is not currently taking    Virtual Behavioral Health Treatment Plan Team Note  MRN: [id number] NAME: [name]  DATE: 03/27/24  Start time: Start Time: 1615 End time: Stop Time: 1630 Total time: Total Time in Minutes (Visit): 15  Total number of Virtual BH Treatment Team Plan encounters: 1/4  Treatment Team Attendees: Eman Morimoto, LCSW and Sharlot Becker, DNP   Diagnoses:    ICD-10-CM   1. Adjustment disorder with mixed anxiety and depressed mood  F43.23       Goals, Interventions and Follow-up Plan Goals: Increase healthy adjustment to current life circumstances Decrease self-medicating behaviors Interventions: Motivational Interviewing Medication Monitoring  Medication Management Recommendations: Continue current medication of record: Strattera  50 mg. Pt has prescription for Sertraline  50 that she is not currently taking  Follow-up Plan: Ongoing IBH supports including short-term counseling and medication consultation and management  History of the present illness Presenting Problem/Current Symptoms: [name] is a 43 y.o.  female with  history of ADHD and mood swings seen in consultation at the request of [name] NP for [facility] collaborative care. Pt is currently taking the following psychiatric medications: straterra 40mg , sertraline  50 mg (not taking sertraline ) .  Current symptoms include poor time management, forgetfulness, irritability, anxiety, stress. Pt reports that she feels she has had some depression after the birth of her daughter 9 years ago it comes and goes.  Pt denies SI, HI, or AVH at time of session. Pt reports that she currently smokes cigarettes, drinks alcohol and smokes THC daily from the time she gets up until going to bed.  Pt is interested in short term IBH counseling to strengthen coping skills and assist with improving work/life balance   Psychiatric History  Depression: Yes Anxiety: Yes Mania: No Psychosis: No PTSD symptoms: No  Past Psychiatric History/Hospitalization(s): Hospitalization for psychiatric illness: No Prior Suicide Attempts: No Prior Self-injurious behavior: No  Psychosocial stressors: work/life balance, feeling overwhelmed, parenting.   Self-harm Behaviors Risk Assessment:   Flowsheet Row Integrated Behavioral Health from 03/26/2024 in [facility] ED to Hosp-Admission (Discharged) from 09/29/2023 in [facility] 21M KIDNEY UNIT  C-SSRS RISK CATEGORY No Risk No Risk    Screenings PHQ-9 Assessments:     03/26/2024   11:45 AM 02/15/2024   10:51 AM  Depression screen PHQ 2/9  Decreased Interest 2 0  Down, Depressed, Hopeless 2 0  PHQ - 2 Score 4 0  Altered sleeping 0 0  Tired, decreased energy 1 0  Change in appetite 3 0  Feeling bad or failure about yourself  3 0  Trouble concentrating 0 0  Moving slowly or fidgety/restless 0 0  Suicidal thoughts 0 0  PHQ-9 Score 11 0  Difficult  doing work/chores Not difficult at all Not difficult at all   GAD-7 Assessments:     03/26/2024   11:47 AM 02/15/2024   10:51 AM  GAD 7 : Generalized  Anxiety Score  Nervous, Anxious, on Edge 1 0  Control/stop worrying 2 0  Worry too much - different things 2 0  Trouble relaxing 0 0  Restless 0 0  Easily annoyed or irritable 2 0  Afraid - awful might happen 2 0  Total GAD 7 Score 9 0  Anxiety Difficulty Not difficult at all Not difficult at all    Past Medical History Past Medical History:  Diagnosis Date   ADHD (attention deficit hyperactivity disorder)    Anemia    Depression    Headache     Vital signs: There were no vitals filed for this visit.  Allergies:  Allergies as of 03/27/2024   (No Known Allergies)    Medication History Current medications:  Outpatient Encounter Medications as of 03/27/2024  Medication Sig   amoxicillin -clavulanate (AUGMENTIN ) 875-125 MG tablet Take 1 tablet by mouth every 12 (twelve) hours. (Patient not taking: Reported on 03/15/2024)   atomoxetine  (STRATTERA ) 40 MG capsule Take 1 capsule (40 mg total) by mouth daily.   folic acid (FOLVITE) 1 MG tablet Take 1 mg by mouth daily.   metoprolol  tartrate (LOPRESSOR ) 50 MG tablet Take 1 tablet (50 mg total) by mouth daily.   Multiple Vitamin (MULTIVITAMIN) tablet Take 1 tablet by mouth daily. (Patient not taking: Reported on 03/15/2024)   NIFEdipine  (ADALAT  CC) 60 MG 24 hr tablet Take 1 tablet (60 mg total) by mouth daily.   NON FORMULARY Take 1 capsule by mouth daily.   NON FORMULARY Take 1 mL by mouth daily at 6 (six) AM.   OVER THE COUNTER MEDICATION Take 1 tablet by mouth daily. Menopause support.   sertraline  (ZOLOFT ) 50 MG tablet Take 1 tablet (50 mg total) by mouth at bedtime.   thiamine (VITAMIN B1) 100 MG tablet Take 100 mg by mouth daily.   valsartan  (DIOVAN ) 80 MG tablet Take 1 tablet (80 mg total) by mouth daily.   Vitamin D , Ergocalciferol , (DRISDOL ) 1.25 MG (50000 UNIT) CAPS capsule Take 1 capsule (50,000 Units total) by mouth every 7 (seven) days.   No facility-administered encounter medications on file as of 03/27/2024.     Scribe  for Treatment Team: Maribel Hadley R Deasiah Hagberg, LCSW

## 2024-04-03 ENCOUNTER — Ambulatory Visit: Admitting: Cardiology

## 2024-04-04 ENCOUNTER — Ambulatory Visit: Admitting: Licensed Clinical Social Worker

## 2024-04-04 DIAGNOSIS — F902 Attention-deficit hyperactivity disorder, combined type: Secondary | ICD-10-CM

## 2024-04-04 DIAGNOSIS — F4323 Adjustment disorder with mixed anxiety and depressed mood: Secondary | ICD-10-CM

## 2024-04-04 NOTE — BH Specialist Note (Signed)
 Integrated Behavioral Health Follow Up In-Person Visit  04/04/24   MRN: [id number] Name: [name]   Number of Integrated Behavioral Health Clinician visits: 3- Third Visit  Session Start time: 1100  Session End time: 1145 Total time in minutes: 45  Types of Service: Individual psychotherapy and Collaborative care  Interpretor:No. Interpretor Name and Language: no interpretor needed  Subjective: [name] is a 43 y.o. female accompanied by self Patient was referred by [name] for managing symptoms of ADHD, mood disorder, anxiety. . Patient reports the following symptoms/concerns: anxiety, depression, continued substance use daily, adhd symptoms, decreased appetite. Duration of problem: ongoing; Severity of problem: moderate  Objective: Mood: Anxious and Depressed and Affect: Blunt, Depressed, and Tearful Risk of harm to self or others: No plan to harm self or others  Life Context: Family and Social: Pt reports that her sister is a big support system for her. Pt reports Fiance in [place] is supportive. Pt feels her children would like to see her make positive changes  School/Work: Pt reports that she is functioning well at work and is prioritizing good work function  Self-Care: Pt reports that she could do better with self care--currently using etoh and thc as primary self-care behaviors and expresses a desire to cut back on etoh first and thc later  Life Changes: pt reports that she is looking forward to traveling to [place] and [place]  in the near future.   Patient and/or Family's Strengths/Protective Factors: Social and Emotional competence, Concrete supports in place (healthy food, safe environments, etc.), Sense of purpose, and Physical Health (exercise, healthy diet, medication compliance, etc.)  Goals Addressed: Patient will:  Reduce symptoms of: anxiety, depression, insomnia, mood instability, and stress   Increase knowledge and/or ability of:  coping skills, healthy habits, self-management skills, and stress reduction   Demonstrate ability to: Increase healthy adjustment to current life circumstances, Increase motivation to adhere to plan of care, and Decrease self-medicating behaviors  Progress towards Goals: Ongoing  Interventions: Interventions utilized:  Motivational Interviewing, Behavioral Activation, and CBT Cognitive Behavioral Therapy Standardized Assessments completed: GAD-7 and PHQ 9     04/04/2024   12:20 PM 03/26/2024   11:47 AM 02/15/2024   10:51 AM  GAD 7 : Generalized Anxiety Score  Nervous, Anxious, on Edge 1 1 0  Control/stop worrying 3 2 0  Worry too much - different things 3 2 0  Trouble relaxing 0 0 0  Restless 0 0 0  Easily annoyed or irritable 3 2 0  Afraid - awful might happen 0 2 0  Total GAD 7 Score 10 9 0  Anxiety Difficulty Somewhat difficult Not difficult at all Not difficult at all        04/04/2024   11:37 AM 03/26/2024   11:45 AM 02/15/2024   10:51 AM  Depression screen PHQ 2/9  Decreased Interest 1 2 0  Down, Depressed, Hopeless 2 2 0  PHQ - 2 Score 3 4 0  Altered sleeping 0 0 0  Tired, decreased energy 3 1 0  Change in appetite 3 3 0  Feeling bad or failure about yourself  3 3 0  Trouble concentrating 2 0 0  Moving slowly or fidgety/restless 0 0 0  Suicidal thoughts 0 0 0  PHQ-9 Score 14 11 0  Difficult doing work/chores Somewhat difficult Not difficult at all Not difficult at all     [facility]04/[facility] in [facility]  Alcohol Use Disorder Identification  Test Final Score (AUDIT) 19    Patient and/or Family Response: Pt reports that her sister is a big support system and an accountability buddy for increased physical activity and decreasing substance use.   Patient Centered Plan: Patient is on the following Treatment Plan(s):   Reduce symptoms of: anxiety, depression, insomnia, mood instability, and  stress   Increase knowledge and/or ability of: coping skills, healthy habits, self-management skills, and stress reduction   Demonstrate ability to: Increase healthy adjustment to current life circumstances, Increase motivation to adhere to plan of care, and Decrease self-medicating behaviors   Clinical Assessment/Diagnosis Encounter Diagnoses  Name Primary?   Adjustment disorder with mixed anxiety and depressed mood Yes   ADHD (attention deficit hyperactivity disorder), combined type     Assessment: Patient currently experiencing symptoms of ADHD, anxiety, depression, and substance use.   Patient may benefit from ongoing IBH short term counseling and medication consultation as needed..  Plan: Follow up with behavioral health clinician on : 04/18/24 Behavioral recommendations: Pt reports that she wants to increase physical activity, decrease drinking, get rx from PCP to help w/ smoking Referral(s): Integrated [employer] (In Clinic)  [place] R Yenifer Saccente, LCSW

## 2024-04-07 ENCOUNTER — Other Ambulatory Visit: Payer: Self-pay | Admitting: Family Medicine

## 2024-04-07 DIAGNOSIS — R0981 Nasal congestion: Secondary | ICD-10-CM | POA: Insufficient documentation

## 2024-04-07 NOTE — Assessment & Plan Note (Signed)
 Refused SSRI, has been referred for IBH counseling.

## 2024-04-07 NOTE — Assessment & Plan Note (Signed)
 Not monitoring blood pressure as advised. Not on antihypertensive medication. Educated on monitoring systolic readings over 130 mmHg. - Advise to monitor blood pressure regularly, focusing on systolic readings.

## 2024-04-07 NOTE — Assessment & Plan Note (Signed)
 Positive response to Atomoxetine  with improved focus and no adverse effects. Prefers Atomoxetine  over Ritalin. - Provide additional refills for Atomoxetine  (Strattera ) 40 MG oral daily. - Schedule follow-up in six months to reassess ADHD management.

## 2024-04-18 ENCOUNTER — Ambulatory Visit (INDEPENDENT_AMBULATORY_CARE_PROVIDER_SITE_OTHER): Admitting: Licensed Clinical Social Worker

## 2024-04-18 DIAGNOSIS — F4323 Adjustment disorder with mixed anxiety and depressed mood: Secondary | ICD-10-CM | POA: Diagnosis not present

## 2024-04-18 NOTE — BH Specialist Note (Signed)
 Integrated Behavioral Health Follow Up In-Person Visit  04/18/24   MRN: [id number] Name: [name]   Number of Integrated Behavioral Health Clinician visits: 4- Fourth Visit  Session Start time: 1035  Session End time: 1110 Total time in minutes: 35  Types of Service: Individual psychotherapy and Collaborative care  Interpretor:No. Interpretor Name and Language: no interpretor needed  Subjective: [name] is a 43 y.o. female accompanied by self Patient was referred by [name] for managing symptoms of ADHD, mood disorder, anxiety.   Patient reports the following symptoms/concerns: anxiety, depression, continued substance use daily, adhd symptoms, decreased appetite.  Duration of problem: ongoing; Severity of problem: moderate  Objective: Mood: Anxious and Depressed and Affect: Blunt, Depressed, and Tearful Risk of harm to self or others: No plan to harm self or others  Life Context: Family and Social: Pt reports that her sister is a big support system for her. Pt reports Fiance in [place] is supportive. Pt feels her children would like to see her make positive changes. Pt states that she thought about not coming in for her session today because I didn't achieve my goals, but you told me to come in anyway, so I did.  Pt states that she has been continuing to drink/smoke because of external triggers. Pt states that her sister mentioned that she wanted pt to go on a trip with bio mother--pt does not want to go stating that she doesn't feel it would be helpful to any family. Pt reports that she doesn't feel safe being under one roof with her bio mom and siblings. Discussed alternative scenarios/boundaries that would allow pt to feel more safe and in control of herself in these situations.   School/Work: Pt reports that she is continuing to function well at work and is prioritizing good work Associate Professor.   Self-Care: Pt reports that she could do better with self  care--currently using etoh and thc as primary self-care behaviors and expresses a desire to cut back on etoh first and thc later. Pt admits that this has been difficult for her since last session.   Life Changes: pt reports that she is looking forward to traveling to [place] and [place]  in the near future. Pt plans on setting boundaries with family members so that she doesn't put herself in a situation where she has no exit.   Patient and/or Family's Strengths/Protective Factors: Social and Emotional competence, Concrete supports in place (healthy food, safe environments, etc.), Sense of purpose, and Physical Health (exercise, healthy diet, medication compliance, etc.)  Goals Addressed: Patient will:  Reduce symptoms of: anxiety, depression, insomnia, mood instability, and stress   Increase knowledge and/or ability of: coping skills, healthy habits, self-management skills, and stress reduction   Demonstrate ability to: Increase healthy adjustment to current life circumstances, Increase motivation to adhere to plan of care, and Decrease self-medicating behaviors  Progress towards Goals: Ongoing  Interventions: Interventions utilized:  Motivational Interviewing, Behavioral Activation, and CBT Cognitive Behavioral Therapy Standardized Assessments completed: GAD-7 and PHQ 9     04/04/2024   12:20 PM 03/26/2024   11:47 AM 02/15/2024   10:51 AM  GAD 7 : Generalized Anxiety Score  Nervous, Anxious, on Edge 1 1 0  Control/stop worrying 3 2 0  Worry too much - different things 3 2 0  Trouble relaxing 0 0 0  Restless 0 0 0  Easily annoyed or irritable 3 2 0  Afraid - awful might happen 0 2 0  Total GAD 7 Score 10 9  0  Anxiety Difficulty Somewhat difficult Not difficult at all Not difficult at all        04/04/2024   11:37 AM 03/26/2024   11:45 AM 02/15/2024   10:51 AM  Depression screen PHQ 2/9  Decreased Interest 1 2 0  Down, Depressed, Hopeless 2 2 0  PHQ - 2 Score 3 4 0  Altered  sleeping 0 0 0  Tired, decreased energy 3 1 0  Change in appetite 3 3 0  Feeling bad or failure about yourself  3 3 0  Trouble concentrating 2 0 0  Moving slowly or fidgety/restless 0 0 0  Suicidal thoughts 0 0 0  PHQ-9 Score 14 11 0  Difficult doing work/chores Somewhat difficult Not difficult at all Not difficult at all     [facility] from [facility]/04/2024 in [facility]  Alcohol Use Disorder Identification Test Final Score (AUDIT) 19    Patient and/or Family Response: Pt reports that her sister is a big support system and an accountability buddy for increased physical activity and decreasing substance use.   Patient Centered Plan: Patient is on the following Treatment Plan(s):   1.  IBH specialist to follow up with therapy. 2.  Patient declines antidepressant or antianxiety medications.  3.  Continue Strattera  40 mg daily and vitamin D  supplementation.   Clinical Assessment/Diagnosis Encounter Diagnosis  Name Primary?   Adjustment disorder with mixed anxiety and depressed mood Yes    Assessment: Patient currently experiencing symptoms of ADHD, anxiety, depression, and substance use.   Patient may benefit from ongoing IBH short term counseling and medication consultation as needed..  Plan: Follow up with behavioral health clinician on : 05/09/24 Behavioral recommendations: Pt reports that she wants to increase physical activity, decrease drinking, get rx from PCP to help w/ smoking Referral(s): Integrated [employer] (In Clinic)  Lindsay R Johathon Overturf, LCSW

## 2024-04-23 ENCOUNTER — Ambulatory Visit: Admitting: Cardiology

## 2024-04-24 ENCOUNTER — Other Ambulatory Visit: Payer: Self-pay | Admitting: Family Medicine

## 2024-04-24 ENCOUNTER — Telehealth: Payer: Self-pay | Admitting: Licensed Clinical Social Worker

## 2024-04-24 ENCOUNTER — Telehealth (INDEPENDENT_AMBULATORY_CARE_PROVIDER_SITE_OTHER): Payer: Self-pay | Admitting: Licensed Clinical Social Worker

## 2024-04-24 DIAGNOSIS — R638 Other symptoms and signs concerning food and fluid intake: Secondary | ICD-10-CM

## 2024-04-24 DIAGNOSIS — F4323 Adjustment disorder with mixed anxiety and depressed mood: Secondary | ICD-10-CM

## 2024-04-24 MED ORDER — NALTREXONE HCL 50 MG PO TABS: 50.0000 mg | ORAL_TABLET | Freq: Every day | ORAL | 3 refills | Status: AC

## 2024-04-24 NOTE — Telephone Encounter (Signed)
Left HIPAA compliant voicemail. 

## 2024-04-24 NOTE — BH Specialist Note (Signed)
 Attestation signed by Warren Becker, PMHNP, DNP 04/24/2024 7:42 AM   Collaborative Care Psychiatric Consultant Case Review   Assessment/Provisional Diagnosis 43 year old female with history of HTN, Anxiety, ADHD, and depression. The patient is referred for anxiety and depression.   Provisional Diagnosis # MDD, Recurrent, moderate # General anxiety disorder   Recommendation 1. Naltrexone 50 mg daily recommended for cravings 2. Strattera  40 mg daily versus PRN 3. Continue vitamin D  supplement 4. BH specialist to follow up.   Thank you for your consult. Please contact our collaborative care team for any questions or concerns.   I spent 20 minutes chart reviewing, discussing with [facility] Speicalist and documenting in the chart.   The above treatment considerations and suggestions are based on consultation with the [facility] specialist and/or PCP and a review of information available in the shared registry and the patient's Electronic Health Record (EHR). I have not personally examined the patient. All recommendations should be implemented with consideration of the patient's relevant prior history and current clinical status. Please feel free to call me with any questions about the care of this patient.     Virtual Behavioral Health Treatment Plan Team Note  MRN: [id number] NAME: [name]  DATE: 04/24/24  Start time: Start Time: 0800 End time: Stop Time: 0815 Total time: Total Time in Minutes (Visit): 15  Total number of Virtual BH Treatment Team Plan encounters: 2/4  Treatment Team Attendees: Natalea Sutliff, LCSW and Sharlot Becker, DNP   Diagnoses:    ICD-10-CM   1. Adjustment disorder with mixed anxiety and depressed mood  F43.23       Goals, Interventions and Follow-up Plan Goals: Increase healthy adjustment to current life circumstances Increase motivation to adhere to plan of care Decrease self-medicating behaviors  Interventions: Motivational Interviewing Behavioral  Activation CBT Cognitive Behavioral Therapy  Medication Management Recommendations: Start Naltrexone 50 mg. Continue Strattera  40 mg, Cont Vit D Supplement.   Follow-up Plan: Ongoing IBH supports including short-term counseling and medication consultation and management  History of the present illness Presenting Problem/Current Symptoms: Pt reports that she feels her symptoms of depression and anxiety come and go, but are better at time of session. Pt continues to want the medication that they gave me in the hospital that took all of my cravings away. Pt reports that she would like to stop drinking etoh and smoking thc on a daily basis.  This does align with overall treatment plan and patient goals (harm reduction; decrease self medicating behaviors).   Psychiatric History  Depression: Yes Anxiety: Yes Mania: No Psychosis: No PTSD symptoms: No  Past Psychiatric History/Hospitalization(s): Hospitalization for psychiatric illness: No Prior Suicide Attempts: No Prior Self-injurious behavior: No  Psychosocial stressors Flowsheet Row Integrated Behavioral Health from 04/04/2024 in [facility]  Current Stressors Other (Comment), Work environment  [parenting stress,  substance use]  Familial Stressors None  Sleep Decreased  Appetite Decreased  Coping ability Overwhelmed  Patient taking medications as prescribed Yes    Self-harm Behaviors Risk Assessment Flowsheet Row Integrated Behavioral Health from 04/04/2024 in [facility]  Self-harm risk factors Substance use disorder, Social withdrawal/isolation  Have you recently had any thoughts about harming yourself? No    Screenings PHQ-9 Assessments:     04/04/2024   11:37 AM 03/26/2024   11:45 AM 02/15/2024   10:51 AM  Depression screen PHQ 2/9  Decreased Interest 1 2 0  Down, Depressed, Hopeless 2 2 0  PHQ -  2 Score 3 4 0  Altered sleeping 0 0 0  Tired, decreased  energy 3 1 0  Change in appetite 3 3 0  Feeling bad or failure about yourself  3 3 0  Trouble concentrating 2 0 0  Moving slowly or fidgety/restless 0 0 0  Suicidal thoughts 0 0 0  PHQ-9 Score 14 11 0  Difficult doing work/chores Somewhat difficult Not difficult at all Not difficult at all   GAD-7 Assessments:     04/04/2024   12:20 PM 03/26/2024   11:47 AM 02/15/2024   10:51 AM  GAD 7 : Generalized Anxiety Score  Nervous, Anxious, on Edge 1 1 0  Control/stop worrying 3 2 0  Worry too much - different things 3 2 0  Trouble relaxing 0 0 0  Restless 0 0 0  Easily annoyed or irritable 3 2 0  Afraid - awful might happen 0 2 0  Total GAD 7 Score 10 9 0  Anxiety Difficulty Somewhat difficult Not difficult at all Not difficult at all    Past Medical History Past Medical History:  Diagnosis Date   ADHD (attention deficit hyperactivity disorder)    Anemia    Depression    Headache     Vital signs: There were no vitals filed for this visit.  Allergies:  Allergies as of 04/24/2024   (No Known Allergies)    Medication History Current medications:  Outpatient Encounter Medications as of 04/24/2024  Medication Sig   amoxicillin -clavulanate (AUGMENTIN ) 875-125 MG tablet Take 1 tablet by mouth every 12 (twelve) hours. (Patient not taking: Reported on 03/15/2024)   atomoxetine  (STRATTERA ) 40 MG capsule Take 1 capsule (40 mg total) by mouth daily.   folic acid (FOLVITE) 1 MG tablet Take 1 mg by mouth daily.   metoprolol  tartrate (LOPRESSOR ) 50 MG tablet Take 1 tablet (50 mg total) by mouth daily.   Multiple Vitamin (MULTIVITAMIN) tablet Take 1 tablet by mouth daily. (Patient not taking: Reported on 03/15/2024)   NIFEdipine  (ADALAT  CC) 60 MG 24 hr tablet Take 1 tablet (60 mg total) by mouth daily.   NON FORMULARY Take 1 capsule by mouth daily.   NON FORMULARY Take 1 mL by mouth daily at 6 (six) AM.   OVER THE COUNTER MEDICATION Take 1 tablet by mouth daily. Menopause support.    sertraline  (ZOLOFT ) 50 MG tablet Take 1 tablet (50 mg total) by mouth at bedtime.   thiamine (VITAMIN B1) 100 MG tablet Take 100 mg by mouth daily.   valsartan  (DIOVAN ) 80 MG tablet Take 1 tablet (80 mg total) by mouth daily.   Vitamin D , Ergocalciferol , (DRISDOL ) 1.25 MG (50000 UNIT) CAPS capsule Take 1 capsule (50,000 Units total) by mouth every 7 (seven) days.   No facility-administered encounter medications on file as of 04/24/2024.     Scribe for Treatment Team: Osman Calzadilla R Valeda Corzine, LCSW

## 2024-05-02 ENCOUNTER — Ambulatory Visit: Attending: Cardiology | Admitting: Internal Medicine

## 2024-05-02 NOTE — Progress Notes (Deleted)
  Cardiology Office Note   Date:  05/02/2024  ID:  Rachel Rison, DOB [birth year], MRN [id number] PCP: Petrina Pries, NP  [facility] Providers Cardiologist:  None { Click to update primary MD,subspecialty MD or APP then REFRESH:1}    History of Present Illness Chekesha Behlke is a 43 y.o. female with past medical history of hypertension, anxiety and depression, ADHD, PFO who was referred for PFO evaluation by her PCP.  Patient was admitted to the hospital between 09/29/2023 to 10/05/2023 for infectious workup and was found to have multiple splenic lesions at the time concerning for possible infection.  She underwent a TEE to evaluate for cardioembolic source which was negative for any vegetations but a small PFO was present after agitated saline was given.  Tobacco use: *** Alcohol use: *** Activity level: *** Diet mainly consists of: *** Pertinent family history: ***    ROS:  ROS  Physical Exam  Physical Exam  VS:  There were no vitals taken for this visit.        Wt Readings from Last 3 Encounters:  03/15/24 156 lb (70.8 kg)  02/15/24 159 lb (72.1 kg)  10/19/23 146 lb (66.2 kg)     EKG Interpretation Date/Time:    Ventricular Rate:    PR Interval:    QRS Duration:    QT Interval:    QTC Calculation:   R Axis:      Text Interpretation:      Studies Reviewed   ***     Risk Assessment/Calculations  {Does this patient have ATRIAL FIBRILLATION?:[phone]} No BP recorded.  {Refresh Note OR Click here to enter BP  :1}***        ASCVD risk score: The 10-year ASCVD risk score (Arnett DK, et al., 2019) is: 0.7%   Values used to calculate the score:     Age: 17 years     Clincally relevant sex: Female     Is Non-Hispanic African American: Yes     Diabetic: No     Tobacco smoker: No     Systolic Blood Pressure: 110 mmHg     Is BP treated: Yes     HDL Cholesterol: 54 mg/dL     Total Cholesterol: 156 mg/dL   ASSESSMENT  Positive bubble  study for incidental small PFO on TEE 10/03/2023*** *** *** *** *** *** *** *** *** *** ***   Plan  *** Cardiac risk counseling and prevention recommendations: Heart healthy/Mediterranean diet with whole grains, fruits, vegetable, fish, lean meats, nuts, and olive oil. Limit salt. Moderate walking, 3-5 times/week for 30-50 minutes each session. Aim for at least 150 minutes.week. Goal should be pace of 3 miles/hour, or walking 1.5 miles in 30 minutes Avoidance of tobacco products. Avoid excess alcohol.  Follow up: ***     {Are you ordering a CV Procedure (e.g. stress test, cath, DCCV, TEE, etc)?   Press F2        :[id number]}    Signed, Emeline FORBES Calender, MD

## 2024-05-09 ENCOUNTER — Ambulatory Visit (INDEPENDENT_AMBULATORY_CARE_PROVIDER_SITE_OTHER): Admitting: Licensed Clinical Social Worker

## 2024-05-09 DIAGNOSIS — Z91199 Patient's noncompliance with other medical treatment and regimen due to unspecified reason: Secondary | ICD-10-CM

## 2024-05-09 NOTE — BH Specialist Note (Signed)
 [facility] Collaborative Care Clinician attempted to connect with patient for scheduled appointment in office. Patient did not show up for the appointment. After waiting 10 minutes, clinician attempted to reach pt by phone to offer virtual visit.   Pt did not answer phone and clinician left HIPAA compliant voicemail for pt to call office to reschedule appointment.

## 2024-06-12 ENCOUNTER — Encounter: Payer: Self-pay | Admitting: Oncology

## 2024-06-26 ENCOUNTER — Ambulatory Visit: Payer: Self-pay | Admitting: Family Medicine

## 2024-07-12 ENCOUNTER — Emergency Department (HOSPITAL_BASED_OUTPATIENT_CLINIC_OR_DEPARTMENT_OTHER)
Admission: EM | Admit: 2024-07-12 | Discharge: 2024-07-12 | Disposition: A | Discharge status: Home / Self Care | Attending: Emergency Medicine | Admitting: Emergency Medicine

## 2024-07-12 ENCOUNTER — Other Ambulatory Visit: Payer: Self-pay

## 2024-07-12 ENCOUNTER — Encounter (HOSPITAL_BASED_OUTPATIENT_CLINIC_OR_DEPARTMENT_OTHER): Payer: Self-pay

## 2024-07-12 DIAGNOSIS — Z79899 Other long term (current) drug therapy: Secondary | ICD-10-CM | POA: Insufficient documentation

## 2024-07-12 DIAGNOSIS — N644 Mastodynia: Secondary | ICD-10-CM | POA: Diagnosis present

## 2024-07-12 MED ORDER — CEPHALEXIN 500 MG PO CAPS: 500.0000 mg | ORAL_CAPSULE | Freq: Two times a day (BID) | ORAL | 0 refills | Status: AC

## 2024-07-12 MED ORDER — CEPHALEXIN 250 MG PO CAPS
500.0000 mg | ORAL_CAPSULE | Freq: Once | ORAL | Status: AC
  Administered 2024-07-12: 500 mg via ORAL
  Filled 2024-07-12: qty 2

## 2024-07-12 NOTE — ED Provider Notes (Signed)
 " [place] EMERGENCY DEPARTMENT AT [facility] Provider Note   CSN: [id number] Arrival date & time: 07/12/24  1444     Patient presents with: Breast Pain and Skin Problem   [name] is a 43 y.o. female presents with concern for right breast pain that has been ongoing for the past 5 days.  Reports she put on nipple jewelry on that was attached with a rubber band to her right nipple 5 days ago. This was not a piercing. Since then, her right breast has been sore.  She denies any abnormal nipple discharge.  Denies any skin changes of her right breast.   HPI     Prior to Admission medications  Medication Sig Start Date End Date Taking? Authorizing Provider  cephALEXin  (KEFLEX ) 500 MG capsule Take 1 capsule (500 mg total) by mouth 2 (two) times daily for 7 days. 07/13/24 07/20/24 Yes Veta Palma, PA-C  atomoxetine  (STRATTERA ) 40 MG capsule Take 1 capsule (40 mg total) by mouth daily. 03/15/24 09/11/24  Petrina Pries, NP  folic acid (FOLVITE) 1 MG tablet Take 1 mg by mouth daily. 09/28/23   [provider]  metoprolol  tartrate (LOPRESSOR ) 50 MG tablet Take 1 tablet (50 mg total) by mouth daily. 02/15/24   Petrina Pries, NP  Multiple Vitamin (MULTIVITAMIN) tablet Take 1 tablet by mouth daily. Patient not taking: Reported on 03/15/2024    [provider]  naltrexone  (DEPADE) 50 MG tablet Take 1 tablet (50 mg total) by mouth daily. 04/24/24   Petrina Pries, NP  NIFEdipine  (ADALAT  CC) 60 MG 24 hr tablet Take 1 tablet (60 mg total) by mouth daily. 02/15/24   Petrina Pries, NP  NON FORMULARY Take 1 capsule by mouth daily.    [provider]  NON FORMULARY Take 1 mL by mouth daily at 6 (six) AM.    [provider]  OVER THE COUNTER MEDICATION Take 1 tablet by mouth daily. Menopause support.    [provider]  sertraline  (ZOLOFT ) 50 MG tablet Take 1 tablet (50 mg total) by mouth at bedtime. 10/19/23 04/16/24  Petrina Pries, NP  thiamine  (VITAMIN B1) 100 MG tablet Take 100 mg by mouth daily.    [provider]  valsartan  (DIOVAN ) 80 MG tablet Take 1 tablet (80 mg total) by mouth daily. 02/26/24 02/25/25  Petrina Pries, NP  Vitamin D , Ergocalciferol , (DRISDOL ) 1.25 MG (50000 UNIT) CAPS capsule Take 1 capsule (50,000 Units total) by mouth every 7 (seven) days. 02/26/24   Petrina Pries, NP    Allergies: Patient has no known allergies.    Review of Systems  Genitourinary:        Right breast pain    Updated Vital Signs BP 120/80 (BP Location: Right Arm)   Pulse 89   Temp 98.5 F (36.9 C) (Oral)   Resp 20   Ht 5' 7 (1.702 m)   Wt 70.8 kg   SpO2 98%   BMI 24.45 kg/m   Physical Exam Vitals and nursing note reviewed. Exam conducted with a chaperone present.  Constitutional:      Appearance: Normal appearance.  HENT:     Head: Atraumatic.  Cardiovascular:     Rate and Rhythm: Normal rate and regular rhythm.  Pulmonary:     Effort: Pulmonary effort is normal.  Chest:       Comments: EMT Robin present to chaperone exam Patient with right breast tissue that feels more tense than the area shown.  No discrete lumps or masses  noted.  No areas of fluctuance.  Mild erythema in the area shown.  No lumps, masses, or overlying skin change to the left breast or nipple  No nipple inversions or skin dimpling bilaterally Genitourinary:    Comments:    Neurological:     General: No focal deficit present.     Mental Status: She is alert.  Psychiatric:        Mood and Affect: Mood normal.        Behavior: Behavior normal.     (all labs ordered are listed, but only abnormal results are displayed) Labs Reviewed - No data to display  EKG: None  Radiology: No results found.   Procedures   Medications Ordered in the ED  cephALEXin  (KEFLEX ) capsule 500 mg (500 mg Oral Given 07/12/24 1642)                                    Medical Decision Making Risk Prescription drug management.     Differential  diagnosis includes but is not limited to cellulitis, laceration, malignancy, fibrocystic breast change, lipoma, abscess, mastoiditis  ED Course:  Upon initial evaluation, patient is very well-appearing, no acute distress.  Normal vital signs.  On breast exam with EMT chaperone present, patient with area of breast tissue on the right lower breast at approximately 12 o'clock position that feels more dense than the surrounding tissue.  No discrete lumps or masses noted.  This area also has slight erythema, but no overlying wounds.  No areas of fluctuance to suggest abscess. Given the slight erythema, question if she may have a cellulitis which may have come from the recent use of the nipple attachment.  Will start on course of Keflex .  However, given the increased breast tissue density in the area of pain, we will have her follow-up with her PCP to obtain a mammogram of this area.  Her PCP is [name] and I have sent her a secure chat message regarding patient's visit today and the recommended mammogram.  Medications Given: Keflex   Impression: Right breast pain  Disposition:  The patient was discharged home with instructions to take course of Keflex  as prescribed.  Reach out to her PCP within the next 3 days to schedule a follow-up regarding her breast pain and to have her PCP schedule a mammogram for her.  I did order a mammogram for her, but discussed that sometimes these are hard to obtain if the PCP is not the one ordering. She understands she needs to follow-up her mammogram results with her PCP. Return precautions given and patient verbalized understanding.    This chart was dictated using voice recognition software, Dragon. Despite the best efforts of this provider to proofread and correct errors, errors may still occur which can change documentation meaning.       Final diagnoses:  Breast pain, right    ED Discharge Orders          Ordered    cephALEXin  (KEFLEX ) 500 MG capsule   2 times daily        07/12/24 1632    MM 3D DIAGNOSTIC MAMMOGRAM UNILATERAL RIGHT BREAST        07/12/24 1658               Veta Palma, PA-C 07/12/24 1659    Pamella Ozell LABOR, DO 07/18/24 1302  "

## 2024-07-12 NOTE — Discharge Instructions (Signed)
 You have been prescribed an antibiotic called cephalexin to treat for possible skin infection on your right breast.  Please take this twice daily for the next 7 days as prescribed.  You were given your first dose here today.  Take your next dose tomorrow morning.  As discussed, you have some changes in your breast tissue on the right side.  You will need a follow-up mammogram for further evaluation of this change.  Unfortunately, we cannot order this from the emergency room.  We need to reach out to your primary care provider as soon as possible so that they can order this for you.  Please return to the ER for any other new or concerning symptoms.

## 2024-07-12 NOTE — ED Triage Notes (Signed)
 Patient arrives POV with complaints of right nipple/breast pain x5 days related to wearing nipple jewelry. Right breast is having some swelling.

## 2024-07-15 ENCOUNTER — Encounter (HOSPITAL_BASED_OUTPATIENT_CLINIC_OR_DEPARTMENT_OTHER): Payer: Self-pay | Admitting: Emergency Medicine

## 2025-02-20 ENCOUNTER — Encounter: Payer: Self-pay | Admitting: Family Medicine
